# Patient Record
Sex: Male | Born: 2000 | Race: White | Hispanic: No | Marital: Single | Smoking: Never smoker
Health system: Southern US, Community
[De-identification: ages and names within clinical notes are randomized; demographics above are authoritative.]

## PROBLEM LIST (undated history)

## (undated) DIAGNOSIS — Q6650 Congenital pes planus, unspecified foot: Secondary | ICD-10-CM

## (undated) DIAGNOSIS — Z87442 Personal history of urinary calculi: Secondary | ICD-10-CM

## (undated) HISTORY — PX: INCISION AND DRAINAGE ABSCESS: SHX5864

---
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
## 2021-01-20 ENCOUNTER — Other Ambulatory Visit: Payer: Self-pay

## 2021-01-20 ENCOUNTER — Encounter (HOSPITAL_BASED_OUTPATIENT_CLINIC_OR_DEPARTMENT_OTHER): Payer: Self-pay | Admitting: Podiatry

## 2021-01-20 NOTE — Progress Notes (Signed)
Spoke w/ via phone for pre-op interview---pt Lab needs dos---- NONE              Lab results------NONE COVID test -----patient states asymptomatic no test needed Arrive at -------0730 NPO after MN NO Solid Food.  Clear liquids from MN until---0630 Med rec completed Medications to take morning of surgery -----NONE Diabetic medication -----NONE Patient instructed no nail polish to be worn day of surgery Patient instructed to bring photo id and insurance card day of surgery Patient aware to have Driver (ride ) / Buyer, retail (GRANDFATHER) caregiver    for 24 hours after surgery  Patient Special Instructions ----- NONE Pre-Op special Istructions -----HAVE NOT RECEIVED H&P FROM PCP VIA DR. PRICES OFFICE Patient verbalized understanding of instructions that were given at this phone interview. Patient denies shortness of breath, chest pain, fever, cough at this phone interview.

## 2021-01-26 NOTE — Progress Notes (Signed)
Received pt's pcp H&P via fax from Dr Samuella Cota office for surgery on 01-27-2021, placed w/ chart

## 2021-01-27 ENCOUNTER — Encounter (HOSPITAL_BASED_OUTPATIENT_CLINIC_OR_DEPARTMENT_OTHER): Payer: Self-pay | Admitting: Podiatry

## 2021-01-27 ENCOUNTER — Encounter: Payer: Self-pay | Admitting: Podiatry

## 2021-01-27 ENCOUNTER — Ambulatory Visit (HOSPITAL_BASED_OUTPATIENT_CLINIC_OR_DEPARTMENT_OTHER)
Admission: RE | Admit: 2021-01-27 | Discharge: 2021-01-27 | Disposition: A | Discharge status: Home / Self Care | Payer: Medicaid Other | Attending: Podiatry | Admitting: Podiatry

## 2021-01-27 ENCOUNTER — Ambulatory Visit (HOSPITAL_BASED_OUTPATIENT_CLINIC_OR_DEPARTMENT_OTHER): Payer: Medicaid Other | Admitting: Anesthesiology

## 2021-01-27 ENCOUNTER — Encounter (HOSPITAL_BASED_OUTPATIENT_CLINIC_OR_DEPARTMENT_OTHER)
Admission: RE | Disposition: A | Discharge status: Home / Self Care | Payer: Self-pay | Source: Home / Self Care | Attending: Podiatry

## 2021-01-27 ENCOUNTER — Other Ambulatory Visit (HOSPITAL_COMMUNITY): Payer: Self-pay

## 2021-01-27 DIAGNOSIS — M2142 Flat foot [pes planus] (acquired), left foot: Secondary | ICD-10-CM | POA: Diagnosis not present

## 2021-01-27 DIAGNOSIS — M7752 Other enthesopathy of left foot: Secondary | ICD-10-CM

## 2021-01-27 DIAGNOSIS — Q666 Other congenital valgus deformities of feet: Secondary | ICD-10-CM | POA: Diagnosis not present

## 2021-01-27 DIAGNOSIS — Z4889 Encounter for other specified surgical aftercare: Secondary | ICD-10-CM

## 2021-01-27 DIAGNOSIS — Q6689 Other  specified congenital deformities of feet: Secondary | ICD-10-CM | POA: Diagnosis not present

## 2021-01-27 DIAGNOSIS — Q6652 Congenital pes planus, left foot: Secondary | ICD-10-CM | POA: Diagnosis not present

## 2021-01-27 HISTORY — PX: FLAT FOOT CORRECTION: SHX6619

## 2021-01-27 SURGERY — REPAIR, PES PLANUS
Anesthesia: General | Site: Foot | Laterality: Left

## 2021-01-27 MED ORDER — OXYCODONE HCL 5 MG PO TABS: 5.0000 mg | ORAL_TABLET | Freq: Once | ORAL | Status: DC | PRN

## 2021-01-27 MED ORDER — OXYCODONE-ACETAMINOPHEN 5-325 MG PO TABS
1.0000 | ORAL_TABLET | ORAL | 0 refills | Status: DC | PRN
  Filled 2021-01-27: qty 20, 4d supply, fill #0

## 2021-01-27 MED ORDER — ONDANSETRON HCL 4 MG/2ML IJ SOLN
INTRAMUSCULAR | Status: DC | PRN
  Administered 2021-01-27: 4 mg via INTRAVENOUS

## 2021-01-27 MED ORDER — DEXMEDETOMIDINE (PRECEDEX) IN NS 20 MCG/5ML (4 MCG/ML) IV SYRINGE
PREFILLED_SYRINGE | INTRAVENOUS | Status: DC | PRN
  Administered 2021-01-27 (×2): 8 ug via INTRAVENOUS
  Administered 2021-01-27: 4 ug via INTRAVENOUS

## 2021-01-27 MED ORDER — LIDOCAINE HCL (PF) 2 % IJ SOLN
INTRAMUSCULAR | Status: AC
  Filled 2021-01-27: qty 10

## 2021-01-27 MED ORDER — BUPIVACAINE LIPOSOME 1.3 % IJ SUSP
INTRAMUSCULAR | Status: DC | PRN
  Administered 2021-01-27: 10 mL via PERINEURAL

## 2021-01-27 MED ORDER — ONDANSETRON HCL 4 MG/2ML IJ SOLN
4.0000 mg | Freq: Once | INTRAMUSCULAR | Status: AC | PRN
  Administered 2021-01-27: 4 mg via INTRAVENOUS

## 2021-01-27 MED ORDER — BUPIVACAINE HCL 0.5 % IJ SOLN
INTRAMUSCULAR | Status: DC | PRN
  Administered 2021-01-27: 15 mL

## 2021-01-27 MED ORDER — ACETAMINOPHEN 325 MG PO TABS: 325.0000 mg | ORAL_TABLET | ORAL | Status: DC | PRN

## 2021-01-27 MED ORDER — FENTANYL CITRATE (PF) 100 MCG/2ML IJ SOLN
INTRAMUSCULAR | Status: AC
  Filled 2021-01-27: qty 2

## 2021-01-27 MED ORDER — ONDANSETRON HCL 4 MG/2ML IJ SOLN
INTRAMUSCULAR | Status: AC
  Filled 2021-01-27: qty 2

## 2021-01-27 MED ORDER — DEXAMETHASONE SODIUM PHOSPHATE 10 MG/ML IJ SOLN
INTRAMUSCULAR | Status: AC
  Filled 2021-01-27: qty 1

## 2021-01-27 MED ORDER — LIDOCAINE HCL (CARDIAC) PF 100 MG/5ML IV SOSY
PREFILLED_SYRINGE | INTRAVENOUS | Status: DC | PRN
  Administered 2021-01-27: 50 mg via INTRAVENOUS

## 2021-01-27 MED ORDER — FENTANYL CITRATE (PF) 100 MCG/2ML IJ SOLN
INTRAMUSCULAR | Status: DC | PRN
  Administered 2021-01-27: 25 ug via INTRAVENOUS
  Administered 2021-01-27: 12.5 ug via INTRAVENOUS
  Administered 2021-01-27: 25 ug via INTRAVENOUS
  Administered 2021-01-27: 12.5 ug via INTRAVENOUS
  Administered 2021-01-27: 25 ug via INTRAVENOUS

## 2021-01-27 MED ORDER — FENTANYL CITRATE (PF) 100 MCG/2ML IJ SOLN
100.0000 ug | Freq: Once | INTRAMUSCULAR | Status: AC
  Administered 2021-01-27: 100 ug via INTRAVENOUS

## 2021-01-27 MED ORDER — MIDAZOLAM HCL 2 MG/2ML IJ SOLN
2.0000 mg | Freq: Once | INTRAMUSCULAR | Status: AC
  Administered 2021-01-27: 2 mg via INTRAVENOUS

## 2021-01-27 MED ORDER — ACETAMINOPHEN 160 MG/5ML PO SOLN: 325.0000 mg | ORAL | Status: DC | PRN

## 2021-01-27 MED ORDER — PROPOFOL 10 MG/ML IV BOLUS
INTRAVENOUS | Status: AC
  Filled 2021-01-27: qty 40

## 2021-01-27 MED ORDER — FENTANYL CITRATE (PF) 100 MCG/2ML IJ SOLN: 25.0000 ug | INTRAMUSCULAR | Status: DC | PRN

## 2021-01-27 MED ORDER — SODIUM CHLORIDE 0.9 % IV SOLN
INTRAVENOUS | Status: AC
  Filled 2021-01-27: qty 2

## 2021-01-27 MED ORDER — MEPERIDINE HCL 25 MG/ML IJ SOLN: 6.2500 mg | INTRAMUSCULAR | Status: DC | PRN

## 2021-01-27 MED ORDER — OXYCODONE HCL 5 MG/5ML PO SOLN: 5.0000 mg | Freq: Once | ORAL | Status: DC | PRN

## 2021-01-27 MED ORDER — CEPHALEXIN 500 MG PO CAPS
500.0000 mg | ORAL_CAPSULE | Freq: Two times a day (BID) | ORAL | 0 refills | Status: DC
  Filled 2021-01-27: qty 14, 7d supply, fill #0

## 2021-01-27 MED ORDER — DEXAMETHASONE SODIUM PHOSPHATE 4 MG/ML IJ SOLN
INTRAMUSCULAR | Status: DC | PRN
  Administered 2021-01-27: 8 mg via INTRAVENOUS

## 2021-01-27 MED ORDER — GLYCOPYRROLATE PF 0.2 MG/ML IJ SOSY
PREFILLED_SYRINGE | INTRAMUSCULAR | Status: AC
  Filled 2021-01-27: qty 2

## 2021-01-27 MED ORDER — PROPOFOL 10 MG/ML IV BOLUS
INTRAVENOUS | Status: DC | PRN
  Administered 2021-01-27: 50 mg via INTRAVENOUS
  Administered 2021-01-27: 20 mg via INTRAVENOUS
  Administered 2021-01-27: 40 mg via INTRAVENOUS
  Administered 2021-01-27: 200 mg via INTRAVENOUS

## 2021-01-27 MED ORDER — GLYCOPYRROLATE 0.2 MG/ML IJ SOLN
INTRAMUSCULAR | Status: DC | PRN
  Administered 2021-01-27: .1 mg via INTRAVENOUS

## 2021-01-27 MED ORDER — SODIUM CHLORIDE 0.9 % IV SOLN
2.0000 g | INTRAVENOUS | Status: AC
  Administered 2021-01-27: 2 g via INTRAVENOUS

## 2021-01-27 MED ORDER — LACTATED RINGERS IV SOLN: INTRAVENOUS | Status: DC

## 2021-01-27 MED ORDER — MIDAZOLAM HCL 2 MG/2ML IJ SOLN
INTRAMUSCULAR | Status: AC
  Filled 2021-01-27: qty 2

## 2021-01-27 SURGICAL SUPPLY — 94 items
1.2 K-Wire ×1 IMPLANT
2.0 Drill Bit ×1 IMPLANT
3.2 Drill bit ×1 IMPLANT
ADH SKN CLS APL DERMABOND .7 (GAUZE/BANDAGES/DRESSINGS)
APL PRP STRL LF DISP 70% ISPRP (MISCELLANEOUS) ×2
AUGMENT INJECTABLE KIT 3CC (Tissue) ×1 IMPLANT
BANDAGE ESMARK 6X9 LF (GAUZE/BANDAGES/DRESSINGS) ×1 IMPLANT
BIT DRILL 2.0 702453 (BIT) ×1 IMPLANT
BIT DRILL 3.2 705232 (BIT) ×1 IMPLANT
BLADE HEX COATED 2.75 (ELECTRODE) ×2 IMPLANT
BLADE OSCILLATING/SAGITTAL (BLADE) ×2
BLADE SURG 15 STRL LF DISP TIS (BLADE) ×2 IMPLANT
BLADE SURG 15 STRL SS (BLADE) ×14
BLADE SW THK.38XMED LNG THN (BLADE) IMPLANT
BNDG CMPR 9X6 STRL LF SNTH (GAUZE/BANDAGES/DRESSINGS) ×1
BNDG ELASTIC 3X5.8 VLCR STR LF (GAUZE/BANDAGES/DRESSINGS) ×1 IMPLANT
BNDG ELASTIC 4X5.8 VLCR STR LF (GAUZE/BANDAGES/DRESSINGS) ×1 IMPLANT
BNDG ELASTIC 6X5.8 VLCR STR LF (GAUZE/BANDAGES/DRESSINGS) ×1 IMPLANT
BNDG ESMARK 6X9 LF (GAUZE/BANDAGES/DRESSINGS) ×2
BNDG GAUZE ELAST 4 BULKY (GAUZE/BANDAGES/DRESSINGS) ×2 IMPLANT
BUR OVAL CARBIDE 4.0 (BURR) ×1 IMPLANT
CHLORAPREP W/TINT 26 (MISCELLANEOUS) ×3 IMPLANT
COVER BACK TABLE 60X90IN (DRAPES) ×2 IMPLANT
COVER LIGHT HANDLE UNIVERSAL (MISCELLANEOUS) ×2 IMPLANT
COVER MAYO STAND STRL (DRAPES) ×1 IMPLANT
CUFF TOURN SGL QUICK 18X4 (TOURNIQUET CUFF) IMPLANT
CUFF TOURN SGL QUICK 24 (TOURNIQUET CUFF) ×2
CUFF TRNQT CYL 24X4X16.5-23 (TOURNIQUET CUFF) IMPLANT
DERMABOND ADVANCED (GAUZE/BANDAGES/DRESSINGS)
DERMABOND ADVANCED .7 DNX12 (GAUZE/BANDAGES/DRESSINGS) ×1 IMPLANT
DRAPE 3/4 80X56 (DRAPES) ×2 IMPLANT
DRAPE EXTREMITY T 121X128X90 (DISPOSABLE) ×2 IMPLANT
DRAPE INCISE 23X17 IOBAN STRL (DRAPES)
DRAPE INCISE 23X17 STRL (DRAPES) ×1 IMPLANT
DRAPE INCISE IOBAN 23X17 STRL (DRAPES) IMPLANT
DRAPE OEC MINIVIEW 54X84 (DRAPES) ×2 IMPLANT
DRAPE SHEET LG 3/4 BI-LAMINATE (DRAPES) IMPLANT
DRAPE U-SHAPE 47X51 STRL (DRAPES) ×1 IMPLANT
DRSG EMULSION OIL 3X3 NADH (GAUZE/BANDAGES/DRESSINGS) ×1 IMPLANT
DRSG PAD ABDOMINAL 8X10 ST (GAUZE/BANDAGES/DRESSINGS) IMPLANT
ELECT REM PT RETURN 9FT ADLT (ELECTROSURGICAL) ×2
ELECTRODE REM PT RTRN 9FT ADLT (ELECTROSURGICAL) ×1 IMPLANT
GAUZE 4X4 16PLY ~~LOC~~+RFID DBL (SPONGE) ×3 IMPLANT
GAUZE SPONGE 4X4 12PLY STRL (GAUZE/BANDAGES/DRESSINGS) ×3 IMPLANT
GAUZE XEROFORM 1X8 LF (GAUZE/BANDAGES/DRESSINGS) ×1 IMPLANT
GLOVE SRG 8 PF TXTR STRL LF DI (GLOVE) ×1 IMPLANT
GLOVE SURG ENC MOIS LTX SZ7.5 (GLOVE) ×2 IMPLANT
GLOVE SURG UNDER POLY LF SZ8 (GLOVE) ×2
GOWN STRL REUS W/TWL XL LVL3 (GOWN DISPOSABLE) ×3 IMPLANT
GUIDEWIRE UNTHREAD 3.2X150 (WIRE) ×1 IMPLANT
GUIDEWIRE UNTHREADED 2.0X150 (WIRE) ×4 IMPLANT
K-WIRE NONTHRD 1.2 (Wire) ×2 IMPLANT
KIT TURNOVER CYSTO (KITS) ×2 IMPLANT
KWIRE NONTHRD 1.2 (Wire) IMPLANT
NDL HYPO 25X1 1.5 SAFETY (NEEDLE) IMPLANT
NEEDLE HYPO 25X1 1.5 SAFETY (NEEDLE) IMPLANT
NS IRRIG 1000ML POUR BTL (IV SOLUTION) ×1 IMPLANT
PACK BASIN DAY SURGERY FS (CUSTOM PROCEDURE TRAY) ×2 IMPLANT
PACK INSTRUMENT ORTHOLOC JOINT (ORTHOPEDIC DISPOSABLE SUPPLIES) ×1 IMPLANT
PAD CAST 4YDX4 CTTN HI CHSV (CAST SUPPLIES) IMPLANT
PADDING CAST ABS 4INX4YD NS (CAST SUPPLIES)
PADDING CAST ABS COTTON 4X4 ST (CAST SUPPLIES) ×1 IMPLANT
PADDING CAST COTTON 4X4 STRL (CAST SUPPLIES) ×2
PENCIL SMOKE EVACUATOR (MISCELLANEOUS) ×1 IMPLANT
PUTTY DBM 1CC 3102-1001 (Putty) ×1 IMPLANT
PUTTY DBM 2.5CC 3102-1002 (Putty) ×1 IMPLANT
SCREW HEADLESS 4.0X46 (Screw) ×1 IMPLANT
SCREW HEADLESS COMP 5.0X60 (Screw) ×1 IMPLANT
SCREW SHORT THREAD 7.0X85 (Screw) ×1 IMPLANT
SLEEVE SCD COMPRESS KNEE MED (STOCKING) ×1 IMPLANT
SPLINT FIBERGLASS 5X30 (CAST SUPPLIES) ×1 IMPLANT
STAPLER VISISTAT 35W (STAPLE) ×1 IMPLANT
STOCKINETTE 6  STRL (DRAPES) ×2
STOCKINETTE 6 STRL (DRAPES) ×1 IMPLANT
SUCTION FRAZIER HANDLE 10FR (MISCELLANEOUS)
SUCTION TUBE FRAZIER 10FR DISP (MISCELLANEOUS) IMPLANT
SUT ETHIBOND 2-0 V-5 NDL (SUTURE) ×1 IMPLANT
SUT ETHIBOND 2-0 V-5 NEEDLE (SUTURE) IMPLANT
SUT ETHILON 3 0 PS 1 (SUTURE) ×3 IMPLANT
SUT MERSILENE 4 0 P 3 (SUTURE) IMPLANT
SUT MNCRL AB 3-0 PS2 27 (SUTURE) ×2 IMPLANT
SUT MON AB 5-0 PS2 18 (SUTURE) ×1 IMPLANT
SUT SILK 3 0 SH 30 (SUTURE) ×1 IMPLANT
SUT VIC AB 0 CT1 36 (SUTURE) IMPLANT
SUT VIC AB 2-0 PS2 27 (SUTURE) ×2 IMPLANT
SUT VIC AB 2-0 SH 27 (SUTURE) ×2
SUT VIC AB 2-0 SH 27XBRD (SUTURE) IMPLANT
SUT VIC AB 3-0 FS2 27 (SUTURE) ×1 IMPLANT
SUT VIC AB 4-0 PS2 18 (SUTURE) IMPLANT
SYR BULB EAR ULCER 3OZ GRN STR (SYRINGE) ×2 IMPLANT
SYR CONTROL 10ML LL (SYRINGE) IMPLANT
TOWEL OR 17X26 10 PK STRL BLUE (TOWEL DISPOSABLE) ×2 IMPLANT
TUBE CONNECTING 12X1/4 (SUCTIONS) ×2 IMPLANT
UNDERPAD 30X36 HEAVY ABSORB (UNDERPADS AND DIAPERS) ×2 IMPLANT

## 2021-01-27 NOTE — Anesthesia Procedure Notes (Signed)
Anesthesia Regional Block: Popliteal block   Pre-Anesthetic Checklist: , timeout performed,  Correct Patient, Correct Site, Correct Laterality,  Correct Procedure, Correct Position, site marked,  Risks and benefits discussed,  Surgical consent,  Pre-op evaluation,  At surgeon's request and post-op pain management  Laterality: Left  Prep: chloraprep       Needles:  Injection technique: Single-shot  Needle Type: Echogenic Stimulator Needle     Needle Length: 5cm  Needle Gauge: 22     Additional Needles:   Procedures:, nerve stimulator,,, ultrasound used (permanent image in chart),,     Nerve Stimulator or Paresthesia:  Response: foot, 0.45 mA  Additional Responses:   Narrative:  Start time: 01/27/2021 7:50 AM End time: 01/27/2021 7:58 AM Injection made incrementally with aspirations every 5 mL.  Performed by: Personally  Anesthesiologist: Bethena Midget, MD  Additional Notes: Functioning IV was confirmed and monitors were applied.  A 73mm 22ga Arrow echogenic stimulator needle was used. Sterile prep and drape,hand hygiene and sterile gloves were used. Ultrasound guidance: relevant anatomy identified, needle position confirmed, local anesthetic spread visualized around nerve(s)., vascular puncture avoided.  Image printed for medical record. Negative aspiration and negative test dose prior to incremental administration of local anesthetic. The patient tolerated the procedure well.

## 2021-01-27 NOTE — Brief Op Note (Signed)
01/27/2021  1:31 PM  PATIENT:  Newt Lukes  20 y.o. male  PRE-OPERATIVE DIAGNOSIS:  FLAT FOOT LEFT FOOT  POST-OPERATIVE DIAGNOSIS:  FLAT FOOT LEFT FOOT  PROCEDURE:  Procedure(s): FLAT FOOT CORRECTION (Left)  SURGEON:  Surgeon(s) and Role:    * Park Liter, DPM - Primary    * Vivi Barrack, DPM - Assisting    ANESTHESIA:   regional and general  EBL:  50 mL   BLOOD ADMINISTERED:none  DRAINS: none   LOCAL MEDICATIONS USED:  NONE  SPECIMEN:  No Specimen  DISPOSITION OF SPECIMEN:  N/A  COUNTS:  YES  TOURNIQUET: See OR Record  DICTATION: .Office manager  PLAN OF CARE: Discharge to home after PACU  PATIENT DISPOSITION:  PACU - hemodynamically stable.   Delay start of Pharmacological VTE agent (>24hrs) due to surgical blood loss or risk of bleeding: not applicable

## 2021-01-27 NOTE — Progress Notes (Signed)
Assisted Dr. Oddono with left, ultrasound guided, popliteal block. Side rails up, monitors on throughout procedure. See vital signs in flow sheet. Tolerated Procedure well. 

## 2021-01-27 NOTE — Transfer of Care (Signed)
Immediate Anesthesia Transfer of Care Note  Patient: Kevin Leonard  Procedure(s) Performed: Procedure(s) (LRB): FLAT FOOT CORRECTION (Left)  Patient Location: PACU  Anesthesia Type: General  Level of Consciousness: awake, alert  and oriented  Airway & Oxygen Therapy: Patient Spontanous Breathing and Patient connected to nasal cannula oxygen  Post-op Assessment: Report given to PACU RN and Post -op Vital signs reviewed and stable  Post vital signs: Reviewed and stable  Complications: No apparent anesthesia complications Last Vitals:  Vitals Value Taken Time  BP 110/53 01/27/21 1333  Temp 36.8 C 01/27/21 1333  Pulse 99 01/27/21 1341  Resp 16 01/27/21 1341  SpO2 98 % 01/27/21 1341  Vitals shown include unvalidated device data.  Last Pain:  Vitals:   01/27/21 1333  TempSrc:   PainSc: Asleep      Patients Stated Pain Goal: 5 (01/27/21 5053)  Complications: No notable events documented.

## 2021-01-27 NOTE — Discharge Instructions (Addendum)
After Surgery Instructions   1) If you are recuperating from surgery anywhere other than home, please be sure to leave [place] the number where you can be reached.  2) Go directly home and rest.  3) Keep the operated foot(feet) elevated six inches above the hip when sitting or lying down. This will help control swelling and pain.  4) Support the elevated foot and leg with pillows. DO NOT PLACE PILLOWS UNDER THE KNEE.  5) DO NOT REMOVE or get your bandages WET, unless you were given different instructions by your doctor to do so. This increases the risk of infection.  6) Wear your surgical shoe or surgical boot at all times when you are up on your feet.  7) A limited amount of pain and swelling may occur. The skin may take on a bruised appearance. DO NOT BE ALARMED, THIS IS NORMAL.  8) For slight pain and swelling, apply an ice pack directly over the bandages for 15 minutes only out of each hour of the day. Continue until seen in the office for your first post op visit. DO NOT APPLY ANY FORM OF HEAT TO THE AREA.  9) Have prescriptions filled immediately and take as directed.  10) Drink lots of liquids, water and juice to stay hydrated.  11) CALL IMMEDIATELY IF:  *Bleeding continues until the following day of surgery  *Pain increases and/or does not respond to medication  *Bandages or cast appears to tight  *If your bandage gets wet  *Trip, fall or stump your surgical foot  *If your temperature goes above 101  *If you have ANY questions at all  12) You are expected to be non-weightbearing after your surgery.   If you need to reach the nurse for any reason, please call: [place]/: [phone] [place]: [phone] [place]: [phone]   Post Anesthesia Home Care Instructions  Activity: Get plenty of rest for the remainder of the day. A responsible individual must stay with you for 24 hours following the procedure.  For the next 24 hours, DO NOT: -Drive  a car -Advertising copywriter -Drink alcoholic beverages -Take any medication unless instructed by your physician -Make any legal decisions or sign important papers.  Meals: Start with liquid foods such as gelatin or soup. Progress to regular foods as tolerated. Avoid greasy, spicy, heavy foods. If nausea and/or vomiting occur, drink only clear liquids until the nausea and/or vomiting subsides. Call your physician if vomiting continues.  Special Instructions/Symptoms: Your throat may feel dry or sore from the anesthesia or the breathing tube placed in your throat during surgery. If this causes discomfort, gargle with warm salt water. The discomfort should disappear within 24 hours.     Regional Anesthesia Blocks  1. Numbness or the inability to move the "blocked" extremity may last from 3-48 hours after placement. The length of time depends on the medication injected and your individual response to the medication. If the numbness is not going away after 48 hours, call your surgeon.  2. The extremity that is blocked will need to be protected until the numbness is gone and the  Strength has returned. Because you cannot feel it, you will need to take extra care to avoid injury. Because it may be weak, you may have difficulty moving it or using it. You may not know what position it is in without looking at it while the block is in effect.  3. For blocks in the legs and feet, returning to weight bearing and walking  needs to be done carefully. You will need to wait until the numbness is entirely gone and the strength has returned. You should be able to move your leg and foot normally before you try and bear weight or walk. You will need someone to be with you when you first try to ensure you do not fall and possibly risk injury.  4. Bruising and tenderness at the needle site are common side effects and will resolve in a few days.  5. Persistent numbness or new problems with movement should be  communicated to the surgeon.   Information for Discharge Teaching: EXPAREL (bupivacaine liposome injectable suspension)   Your surgeon or anesthesiologist gave you EXPAREL(bupivacaine) to help control your pain after surgery.  EXPAREL is a local anesthetic that provides pain relief by numbing the tissue around the surgical site. EXPAREL is designed to release pain medication over time and can control pain for up to 72 hours. Depending on how you respond to EXPAREL, you may require less pain medication during your recovery.  Possible side effects: Temporary loss of sensation or ability to move in the area where bupivacaine was injected. Nausea, vomiting, constipation Rarely, numbness and tingling in your mouth or lips, lightheadedness, or anxiety may occur. Call your doctor right away if you think you may be experiencing any of these sensations, or if you have other questions regarding possible side effects.  Follow all other discharge instructions given to you by your surgeon or nurse. Eat a healthy diet and drink plenty of water or other fluids.  If you return to the hospital for any reason within 96 hours following the administration of EXPAREL, it is important for health care providers to know that you have received this anesthetic. A teal colored band has been placed on your arm with the date, time and amount of EXPAREL you have received in order to alert and inform your health care providers. Please leave this armband in place for the full 96 hours following administration, and then you may remove the band.  (May remove band Sunday, July 31st)

## 2021-01-27 NOTE — Anesthesia Postprocedure Evaluation (Signed)
Anesthesia Post Note  Patient: Kevin Leonard  Procedure(s) Performed: FLAT FOOT CORRECTION (Left: Foot)     Patient location during evaluation: PACU Anesthesia Type: General Level of consciousness: awake and alert Pain management: pain level controlled Vital Signs Assessment: post-procedure vital signs reviewed and stable Respiratory status: spontaneous breathing, nonlabored ventilation, respiratory function stable and patient connected to nasal cannula oxygen Cardiovascular status: blood pressure returned to baseline and stable Postop Assessment: no apparent nausea or vomiting Anesthetic complications: no   No notable events documented.  Last Vitals:  Vitals:   01/27/21 1400 01/27/21 1415  BP: 115/61 118/72  Pulse: (!) 108 (!) 111  Resp: 16 16  Temp:  36.6 C  SpO2: 96% 96%    Last Pain:  Vitals:   01/27/21 1415  TempSrc:   PainSc: 0-No pain                 Nayab Aten

## 2021-01-27 NOTE — Anesthesia Procedure Notes (Signed)
Procedure Name: LMA Insertion Date/Time: 01/27/2021 9:00 AM Performed by: Earmon Phoenix, CRNA Pre-anesthesia Checklist: Patient identified, Emergency Drugs available, Suction available, Patient being monitored and Timeout performed Patient Re-evaluated:Patient Re-evaluated prior to induction Oxygen Delivery Method: Circle system utilized Preoxygenation: Pre-oxygenation with 100% oxygen Induction Type: IV induction Ventilation: Mask ventilation without difficulty LMA: LMA inserted LMA Size: 5.0 Number of attempts: 1 Placement Confirmation: positive ETCO2, CO2 detector and breath sounds checked- equal and bilateral Tube secured with: Tape Dental Injury: Teeth and Oropharynx as per pre-operative assessment

## 2021-01-27 NOTE — Anesthesia Preprocedure Evaluation (Signed)
Anesthesia Evaluation  Patient identified by MRN, date of birth, ID band Patient awake    Reviewed: Allergy & Precautions, H&P , NPO status , Patient's Chart, lab work & pertinent test results, reviewed documented beta blocker date and time   Airway Mallampati: II  TM Distance: >3 FB Neck ROM: full   Comment: Prognathia  Dental no notable dental hx. (+) Teeth Intact, Dental Advisory Given   Pulmonary neg pulmonary ROS,    Pulmonary exam normal breath sounds clear to auscultation       Cardiovascular Exercise Tolerance: Good negative cardio ROS   Rhythm:regular Rate:Normal     Neuro/Psych negative neurological ROS  negative psych ROS   GI/Hepatic negative GI ROS, Neg liver ROS,   Endo/Other  negative endocrine ROS  Renal/GU negative Renal ROS  negative genitourinary   Musculoskeletal   Abdominal   Peds  Hematology negative hematology ROS (+)   Anesthesia Other Findings   Reproductive/Obstetrics negative OB ROS                             Anesthesia Physical Anesthesia Plan  ASA: 2  Anesthesia Plan: General   Post-op Pain Management: GA combined w/ Regional for post-op pain   Induction:   PONV Risk Score and Plan: 2 and Ondansetron and Dexamethasone  Airway Management Planned: Oral ETT and LMA  Additional Equipment: None  Intra-op Plan:   Post-operative Plan: Extubation in OR  Informed Consent: I have reviewed the patients History and Physical, chart, labs and discussed the procedure including the risks, benefits and alternatives for the proposed anesthesia with the patient or authorized representative who has indicated his/her understanding and acceptance.     Dental Advisory Given  Plan Discussed with: CRNA and Anesthesiologist  Anesthesia Plan Comments: (Discussed both nerve block for pain relief post-op and GA; including NV, sore throat, dental injury, and pulmonary  complications)        Anesthesia Quick Evaluation

## 2021-01-27 NOTE — H&P (Signed)
  Subjective:  Patient ID: Kevin Leonard, male    DOB: [birth year],  MRN: [id number]  No chief complaint on file.  20 y.o. male presents today for elective correction of his flatfoot deformity. All questions answered and addressed.  Objective:  Physical Exam: Physical Exam: warm, good capillary refill, no trophic changes or ulcerative lesions, normal DP and PT pulses and normal sensory exam. Left Foot: hindfoot rectus, midfoot abductus present. No pain to palpation  Right Foot: hindfoot rectus, no pain to palpation   Radiographs: X-ray of the left foot: sever pes planus with forefoot abduction and talar declination.   Assessment/Plan:  Patient was evaluated and treated and all questions answered.  Pes Planus -Consent form reviewed and signed by patient -LLE marked -Ok to proceed to OR -Planned procedures: correction of flatfoot left foot with joint fusions as indicated - STJ, TNJ, CCJ, 1st met cuneiform and naviculocuneiform. Includes removal of hardware as needed.

## 2021-01-27 NOTE — Progress Notes (Signed)
Orthopedic Tech Progress Note Patient Details:  Kevin Leonard [birth year] [id number]  Ortho Devices Type of Ortho Device: CAM walker   Post Interventions Instructions Provided: Care of device  Kevin Leonard 01/27/2021, 2:09 PM

## 2021-01-29 ENCOUNTER — Telehealth: Payer: Self-pay | Admitting: *Deleted

## 2021-01-29 NOTE — Telephone Encounter (Signed)
Called and spoke with the grand father and stated that I was calling to see how the patient was doing after having surgery with Dr Ardelle Anton and patient is doing pretty good and there is not any fever and not any chills or nausea and did hurt some yesterday and I stated to ice and elevate and stay off of it as much as possible and to call the office if any concerns or questions. Misty Stanley

## 2021-02-01 ENCOUNTER — Encounter (HOSPITAL_BASED_OUTPATIENT_CLINIC_OR_DEPARTMENT_OTHER): Payer: Self-pay | Admitting: Podiatry

## 2021-02-02 ENCOUNTER — Ambulatory Visit (INDEPENDENT_AMBULATORY_CARE_PROVIDER_SITE_OTHER): Payer: Medicaid Other | Admitting: Podiatry

## 2021-02-02 ENCOUNTER — Encounter: Payer: Medicaid Other | Admitting: Podiatry

## 2021-02-02 ENCOUNTER — Other Ambulatory Visit: Payer: Self-pay

## 2021-02-02 ENCOUNTER — Ambulatory Visit (INDEPENDENT_AMBULATORY_CARE_PROVIDER_SITE_OTHER): Payer: Medicaid Other

## 2021-02-02 DIAGNOSIS — Q6652 Congenital pes planus, left foot: Secondary | ICD-10-CM

## 2021-02-02 DIAGNOSIS — Z9889 Other specified postprocedural states: Secondary | ICD-10-CM

## 2021-02-02 NOTE — Op Note (Signed)
Patient Name: Kevin Leonard DOB: Mar 27, 2001  MRN: [id number]   Date of Surgery: 01/27/2021  Surgeon: Dr. Hardie Pulley, DPM Assistants: Dr. Celesta Gentile, DPM  Pre-operative Diagnosis:  Flatfoot, hindfoot valgus, forefoot abductus Post-operative Diagnosis:  Same Procedures:  1) Subtalar arthrodesis  2) Talonavicular arthrodesis  3) Removal of hardware Pathology/Specimens: * No specimens in log * Anesthesia: General Hemostasis:  Total Tourniquet Time Documented: Thigh (Left) - 108 minutes Thigh (Left) - 104 minutes Total: Thigh (Left) - 212 minutes  Estimated Blood Loss: 50 mL Materials:  Implant Name Type Inv. Item Serial No. Manufacturer Lot No. LRB No. Used Action  SCREW HEADLESS COMP 5.0X50 - [id number] Screw SCREW HEADLESS COMP 5.0X50  [employer] ON SET Left 1 Explanted  SCREW HEADLESS COMP 5.0X60 - [id number] Screw SCREW HEADLESS COMP 5.0X60  [employer] IN TRAY Left 1 Explanted  PUTTY DBM 1CC 3102-1001 - [id number] Putty PUTTY DBM 1CC 3102-1001  [employer] [id number] Left 1 Implanted  PUTTY DBM 2.5CC 3102-1002 - [id number] Putty PUTTY DBM 2.5CC 3102-1002  [employer] [id number] Left 1 Implanted  AUGMENT INJECTABLE KIT 3CC - [id number] Tissue AUGMENT INJECTABLE KIT 3CC  [employer] [id number] Left 1 Implanted  SCREW SHORT THREAD 7.0X85 - [id number] Screw SCREW SHORT THREAD 7.0X85  [employer] IN TRAY Left 1 Implanted  SCREW HEADLESS COMP 5.0X60 - [id number] Screw SCREW HEADLESS COMP 5.0X60  [employer] IN TRAY Left 1 Implanted  SCREW HEADLESS 4.0X46 - [id number] Screw SCREW HEADLESS 4.0X46  [employer]  Left 1 Implanted  K-WIRE NONTHRD 1.2 - [id number] Wire K-WIRE NONTHRD 1.2  [employer]  Left 1 Implanted and Explanted   Medications: none Complications: none  Indications for Procedure:  This is a 20 y.o. male with a severe flatfoot deformity previously failed surgical intervention.  Discussed would  benefit from revision.  All risk benefits and alternatives of surgery discussed with the patient no guarantees given.   Procedure in Detail: Patient was identified in pre-operative holding area. Formal consent was signed and the left lower extremity was marked. Patient was brought back to the operating room. Anesthesia was induced. The extremity was prepped and draped in the usual sterile fashion. Timeout was taken to confirm patient name, laterality, and procedure prior to incision.   Attention was then directed to the medial aspect of the hindfoot over the talar neck.  Fluoroscopy was used to confirm the landmarks for the talonavicular joint.  An incision was made overlying this area and dissection was carried down through skin and subcu tissue with care to avoid all vital neurovascular structures all bleeders were cauterized with cautery.  Dissection was continued down to level of the talonavicular joint a linear incision was made in the joint capsule.  The capsule was then gently freed from the bone and the talonavicular joint was exposed.  K wires and abdomen retractors were then placed into the talus and navicular bone and the joint was opened.  The joint was then prepped for fusion with a curette and osteotomes.  The subchondral plate was penetrated with a K wire.  Attention directed to the lateral aspect of foot.  Medihoney was then made overlying the sinus tarsi.  Dissection continued in similar manner to the subtalar joint capsule.  Capsule was incised and gently freed from the bone.  A lamina spreader was placed in the sinus tarsi to expose the posterior facet.  Cartilaginous surface of the posterior aspect of the subtalar joint were removed with osteotome and curette.  Subchondral  plate was punctured with a K wire in preparation for fusion.  Attention was directed to the the posterior aspect of heel.  A linear incision was made in the posterior heel at the area of the previous insertion of  hardware.  K wires were placed into the previous tracks of the screws under fluoroscopic guidance. The screws were then sequentially removed.   At this point the tourniquet was deflated for tourniquet break and 20 minutes allowed to pass prior to reinflation.  Both joints were examined to ensure that all cartilage have been removed in preparation for fusion.  A mixture of DBX putty and [facility] augment graft was placed into each arthrodesis site.  The talonavicular joint was then reduced to maximally adduct the foot and reduce the flatfoot deformity.  This was then held in position with K wires.  Once positioning was confirmed this was then fixated with two 3.0 crossing screws. The subtalar joint was then similarly reduced and held in position with a K-wire. Fluoroscopy was used to confirm adequate positioning. The arthrodesis site was then fixated with a size 7 screw with good compression across the subtalar joint noted.  Final fluoroscopic images showed good reduction of hindfoot deformity with mild midfoot abductus, as well as compression across all arthrodesis sites.  At this point all incisions were then copiously irrigated.  They were closed with 3-0 Vicryl 4-0 Monocryl and 4-nylon and skin staples.   The foot was then dressed with Xeroform 4 x 4's Kerlix, cast padding, posterior splint. Patient tolerated the procedure well.   Dr. Celesta Gentile was scrubbed and present for the majority of procedure including all integral parts, and assisted in removal of hardware, retraction, placement of hardware.  Disposition: Following a period of post-operative monitoring, patient will be transferred home.

## 2021-02-02 NOTE — Progress Notes (Signed)
  Subjective:  Patient ID: Kevin Leonard, male    DOB: 2000/12/30,  MRN: [id number]  Chief Complaint  Patient presents with   Routine Post Op    POV #1 DOS 01/27/2021 CORRECTION OF FLAT FOOT DEFORMITY. Pt states he is doing well no NV, fever or chills.    DOS: 01/27/21 Procedure: Flatfoot correction left foot with subtalar/talonavicular arthrodesis, removal of hardware   20 y.o. male presents with the above complaint. History confirmed with patient.   Objective:  Physical Exam: tenderness at the surgical site, local edema noted, and calf supple, nontender. Incision: healing well, no significant drainage, no dehiscence, no significant erythema  No images are attached to the encounter.  Radiographs: X-ray of the left foot: consistent with post-op state, with good alignment and no evidence of hardware complication  Assessment:   1. Post-operative state   2. Congenital pes planus of left foot    Plan:  Patient was evaluated and treated and all questions answered.  Post-operative State -XR reviewed with patient -Dressing applied consisting of sterile gauze, kerlix, and ACE bandage, placed in Posterior splint -NWB with crutches -No Pain medication refill needed today -Plan for staple removal next week. -XRs needed at follow-up: none   No follow-ups on file.

## 2021-02-16 ENCOUNTER — Other Ambulatory Visit: Payer: Self-pay

## 2021-02-16 ENCOUNTER — Encounter: Payer: Medicaid Other | Admitting: Podiatry

## 2021-02-16 ENCOUNTER — Ambulatory Visit (INDEPENDENT_AMBULATORY_CARE_PROVIDER_SITE_OTHER): Payer: Medicaid Other | Admitting: Podiatry

## 2021-02-16 DIAGNOSIS — Q6652 Congenital pes planus, left foot: Secondary | ICD-10-CM

## 2021-02-16 DIAGNOSIS — Z9889 Other specified postprocedural states: Secondary | ICD-10-CM

## 2021-02-16 NOTE — Progress Notes (Signed)
  Subjective:  Patient ID: Kevin Leonard, male    DOB: 2001/06/16,  MRN: [id number]  Chief Complaint  Patient presents with   Routine Post Op    POV #2 DOS 01/27/2021    DOS: 01/27/21 Procedure: Flatfoot correction left foot with subtalar/talonavicular arthrodesis, removal of hardware   20 y.o. male presents with the above complaint. History confirmed with patient. Doing well, taking OTC pain medication not having significant pain.  Objective:  Physical Exam: tenderness at the surgical site, local edema noted, and calf supple, nontender. Incision: healing well, no significant drainage, no dehiscence, no significant erythema   Assessment:   1. Congenital pes planus of left foot   2. Post-operative state    Plan:  Patient was evaluated and treated and all questions answered.  Post-operative State -XR reviewed with patient -Dressing applied consisting of sterile gauze, kerlix, and ACE bandage, placed in CAM boot -NWB with crutches -Every other staple removed today. Plan for full staple removal next week. -XRs needed at follow-up: none  No follow-ups on file.

## 2021-03-02 ENCOUNTER — Ambulatory Visit (INDEPENDENT_AMBULATORY_CARE_PROVIDER_SITE_OTHER): Payer: Medicaid Other | Admitting: Podiatry

## 2021-03-02 ENCOUNTER — Ambulatory Visit (INDEPENDENT_AMBULATORY_CARE_PROVIDER_SITE_OTHER): Payer: Medicaid Other

## 2021-03-02 ENCOUNTER — Other Ambulatory Visit: Payer: Self-pay

## 2021-03-02 DIAGNOSIS — Z9889 Other specified postprocedural states: Secondary | ICD-10-CM

## 2021-03-02 NOTE — Progress Notes (Signed)
  Subjective:  Patient ID: Kevin Leonard, male    DOB: 02-Mar-2001,  MRN: [id number]  Chief Complaint  Patient presents with   Routine Post Op    POV #3DOS 01/27/2021 CORRECTION OF FLAT FOOT DEFORMITY. Pt states he is doing well. Edema has decreased a lot per pt. No questions or concerns at the time.    DOS: 01/27/21 Procedure: Flatfoot correction left foot with subtalar/talonavicular arthrodesis, removal of hardware   20 y.o. male presents with the above complaint. History confirmed with patient. States the edema is reducing, does not have much pain controlled with OTC meds.  Objective:  Physical Exam: no tenderness at the surgical site, local edema noted, and calf supple, nontender. Incision: healing well, no significant drainage, no dehiscence, no significant erythema   Assessment:   1. Post-operative state    Plan:  Patient was evaluated and treated and all questions answered.  Post-operative State -XR reviewed. Good positioning and maintained correction with intact hardware -NWB with crutches -Remaining sutures, staples removed. Steri strips applied. Ok to shower. ACE bandage applied for reduction of edema XRs needed at follow-up: 3 view Foot and 3 view Ankle  Return in about 3 weeks (around 03/23/2021) for Post-Op (with XRs).

## 2021-03-23 ENCOUNTER — Ambulatory Visit (INDEPENDENT_AMBULATORY_CARE_PROVIDER_SITE_OTHER): Payer: Medicaid Other

## 2021-03-23 ENCOUNTER — Other Ambulatory Visit: Payer: Self-pay

## 2021-03-23 ENCOUNTER — Ambulatory Visit (INDEPENDENT_AMBULATORY_CARE_PROVIDER_SITE_OTHER): Payer: Medicaid Other | Admitting: Podiatry

## 2021-03-23 DIAGNOSIS — Q6652 Congenital pes planus, left foot: Secondary | ICD-10-CM

## 2021-03-23 DIAGNOSIS — Z9889 Other specified postprocedural states: Secondary | ICD-10-CM

## 2021-03-23 NOTE — Progress Notes (Signed)
  Subjective:  Patient ID: Kevin Leonard, male    DOB: 13-Dec-2000,  MRN: [id number]  Chief Complaint  Patient presents with   Routine Post Op    POV #4DOS 01/27/2021 CORRECTION OF FLAT FOOT DEFORMITY   DOS: 01/27/21 Procedure: Flatfoot correction left foot with subtalar/talonavicular arthrodesis, removal of hardware   20 y.o. male presents with the above complaint. History confirmed with patient. Denies pain or post-op issues. Doing well with NWB with the crutches. Not taking any pain medications.  Objective:  Physical Exam: no tenderness at the surgical site, local edema noted, and calf supple, nontender. Incision: well healed  Assessment:   1. Congenital pes planus of left foot   2. Post-operative state    Plan:  Patient was evaluated and treated and all questions answered.  Post-operative State -XR reviewed. Arhtrodesis sites appear to be bridged. Can start WB with CAM boot. Patient to slowly transition WB. -Refer for PT. He is very weak and would benefit from ROM exercises and strengthening. -Will f/u in 3 weeks for new XR and plan to transition out of the boot.  XRs needed at follow-up: 3 view Foot and 3 view Ankle  Return in about 3 weeks (around 04/13/2021) for Post-Op (with XRs).

## 2021-04-08 ENCOUNTER — Other Ambulatory Visit: Payer: Self-pay

## 2021-04-08 ENCOUNTER — Encounter (HOSPITAL_COMMUNITY): Payer: Self-pay | Admitting: Physical Therapy

## 2021-04-08 ENCOUNTER — Ambulatory Visit (HOSPITAL_COMMUNITY): Payer: Medicaid Other | Attending: Podiatry | Admitting: Physical Therapy

## 2021-04-08 DIAGNOSIS — R262 Difficulty in walking, not elsewhere classified: Secondary | ICD-10-CM | POA: Diagnosis present

## 2021-04-08 DIAGNOSIS — M25675 Stiffness of left foot, not elsewhere classified: Secondary | ICD-10-CM | POA: Diagnosis present

## 2021-04-08 DIAGNOSIS — M25572 Pain in left ankle and joints of left foot: Secondary | ICD-10-CM | POA: Insufficient documentation

## 2021-04-08 NOTE — Therapy (Signed)
[facility] [address] [place], [place], [address] Phone: [phone]   Fax:  [phone]  Physical Therapy Evaluation  Patient Details  Name: Kevin Leonard MRN: [id number] Date of Birth: March 12, 2001 Referring Provider (PT): Ventura Sellers   Encounter Date: 04/08/2021   PT End of Session - 04/08/21 1043     Visit Number 1    Number of Visits 12    Date for PT Re-Evaluation 05/20/21    Authorization Type healthy blue    Progress Note Due on Visit 10    PT Start Time 1015    PT Stop Time 1045    PT Time Calculation (min) 30 min    Activity Tolerance Patient tolerated treatment well    Behavior During Therapy [facility] for tasks assessed/performed             Past Medical History:  Diagnosis Date   Pes planus, congenital    bilaterally    Past Surgical History:  Procedure Laterality Date   CALCANEAL OSTEOTOMY Left 12/20/2017   Procedure: [name] CALCANEAL OSTEOTOMY;  Surgeon: Park Liter, DPM;  Location: [facility];  Service: Podiatry;  Laterality: Left;   CALCANEAL OSTEOTOMY Right 02/28/2018   Procedure: [name] CALCANEAL OSTEOTOMY;  Surgeon: Park Liter, DPM;  Location: [facility];  Service: Podiatry;  Laterality: Right;   CALCANEAL OSTEOTOMY Left 07/25/2018   Procedure: COTTON TARSAL OSTEOTOMY AND [name] CALCANIAL OSTEOTOMY;  Surgeon: Park Liter, DPM;  Location: [facility];  Service: Podiatry;  Laterality: Left;   CALCANEAL OSTEOTOMY Left 04/10/2019   Procedure: CALCANEAL OSTECTOMY LEFT FOOT;  Surgeon: Park Liter, DPM;  Location: [facility];  Service: Podiatry;  Laterality: Left;   FLAT FOOT CORRECTION Left 01/27/2021   Procedure: FLAT FOOT CORRECTION;  Surgeon: Park Liter, DPM;  Location: [facility];  Service: Podiatry;  Laterality: Left;   FLAT FOOT RECONSTRUCTION-TAL GASTROC RECESSION Right 02/28/2018   Procedure: GASTROCNEMIUS  RECESSION;  Surgeon: Park Liter, DPM;  Location: [facility];  Service: Podiatry;  Laterality: Right;   GASTROC RECESSION EXTREMITY Left 12/20/2017   Procedure: GASTROC RECESSION EXTREMITY;  Surgeon: Park Liter, DPM;  Location: [facility];  Service: Podiatry;  Laterality: Left;   GRAFT APPLICATION Right 02/28/2018   Procedure: HEMI-CYLINDRICAL INTERCALARY ALLOGRAFT;  Surgeon: Park Liter, DPM;  Location: [facility];  Service: Podiatry;  Laterality: Right;   HARDWARE REMOVAL Left 07/25/2018   Procedure: HARDWARE REMOVAL;  Surgeon: Park Liter, DPM;  Location: [facility];  Service: Podiatry;  Laterality: Left;   INCISION AND DRAINAGE ABSCESS     to buttock at age of 2   METATARSAL OSTEOTOMY Left 12/20/2017   Procedure: METATARSAL OSTEOTOMY;  Surgeon: Park Liter, DPM;  Location: [facility];  Service: Podiatry;  Laterality: Left;   METATARSAL OSTEOTOMY Right 02/28/2018   Procedure: COTTON TARSAL OSTEOTOMY;  Surgeon: Park Liter, DPM;  Location: [facility];  Service: Podiatry;  Laterality: Right;   OSTECTOMY Left 12/20/2017   Procedure: OSTECTOMY;  Surgeon: Park Liter, DPM;  Location: [facility];  Service: Podiatry;  Laterality: Left;   OSTECTOMY Right 02/28/2018   Procedure: CALCANEAL OSTECTOMY;  Surgeon: Park Liter, DPM;  Location: [facility];  Service: Podiatry;  Laterality: Right;   TENDON LENGTHENING Left 04/10/2019   Procedure: [facility] ADVANCED TENDON LEFT FOOT;  Surgeon:  Park Liter, DPM;  Location: [facility];  Service: Podiatry;  Laterality: Left;    There were no vitals filed for this visit.    Subjective Assessment - 04/08/21 1015     Subjective Kevin Leonard states that three years ago his Lt foot turned purple.  He states that due to his congenital deformity he was not walking on his foot he was walking on his  ankle bones which cut off the circulation to his foot.   He had surgery on both feet  six months apart.  The right surgery was successful, however, to date he has had five surgeries on his left foot.   How long can you sit comfortably? no problem    How long can you stand comfortably? Pt will automatically put most of the wt on his right foot.    How long can you walk comfortably? Pt starts having pain after walking for 30 minutes with a crutch and a cam boot.    Patient Stated Goals to improve his mobility and strength.    Currently in Pain? Yes    Pain Score 3     Pain Location Ankle    Pain Orientation Left    Pain Descriptors / Indicators Aching    Pain Type Chronic pain    Pain Onset More than a month ago    Pain Frequency Intermittent    Aggravating Factors  walking    Pain Relieving Factors rest    Effect of Pain on Daily Activities limits                [facility] PT Assessment - 04/08/21 0001       Assessment   Medical Diagnosis s/p surgical correction for flat foot    Referring Provider (PT) Ventura Sellers    Onset Date/Surgical Date 01/27/21    Next MD Visit not sure    Prior Therapy not for this surgery      Precautions   Precautions None      Restrictions   Weight Bearing Restrictions Yes      Balance Screen   Has the patient fallen in the past 6 months No    Has the patient had a decrease in activity level because of a fear of falling?  Yes    Is the patient reluctant to leave their home because of a fear of falling?  No      Home Environment   Living Environment Private residence    Type of Home House    Home Access Stairs to enter   no problem     Prior Function   Level of Independence Independent    Vocation Unemployed    Leisure [employer] boarding , racing RC cars, playing basketball      Cognition   Overall Cognitive Status Within Functional Limits for tasks assessed      ROM / Strength   AROM / PROM / Strength AROM;Strength      AROM   AROM  Assessment Site Ankle    Right/Left Ankle Left    Left Ankle Dorsiflexion -2    Left Ankle Plantar Flexion 38    Left Ankle Inversion -2    Left Ankle Eversion 5      Strength   Strength Assessment Site Hip;Knee;Ankle    Right/Left Hip Left    Left Hip Flexion 4+/5    Left Hip Extension 4+/5    Left Hip ABduction 4+/5    Right/Left Knee Left  Left Knee Flexion 4+/5    Left Knee Extension 4/5    Right/Left Ankle Left    Left Ankle Dorsiflexion 3/5    Left Ankle Plantar Flexion 2/5    Left Ankle Inversion 2+/5    Left Ankle Eversion 2+/5                        Objective measurements completed on examination: See above findings.       [facility] Adult PT Treatment/Exercise - 04/08/21 0001       Exercises   Exercises Ankle      Ankle Exercises: Seated   Towel Crunch 3 reps      Ankle Exercises: Supine   Other Supine Ankle Exercises ROM all x 10 hold 5 secnd                     PT Education - 04/08/21 1145     Education Details HEP    Person(s) Educated Patient    Methods Explanation;Handout    Comprehension Verbalized understanding;Returned demonstration              PT Short Term Goals - 04/08/21 1155       PT SHORT TERM GOAL #1   Title PT to be I in HEP in order to improve ROM and strength  of Lt ankle.    Time 3    Period Weeks    Status New    Target Date 04/29/21      PT SHORT TERM GOAL #2   Title Pt to be walking with camboot without crutches.    Time 3    Period Weeks    Status New      PT SHORT TERM GOAL #3   Title Pt able to ambulate for an hour without increased pain.    Time 3    Period Weeks    Status New               PT Long Term Goals - 04/08/21 1157       PT LONG TERM GOAL #1   Title Pt to be I in advance HEP in order to increase strength in his Lt LE to be able to walk without his cam boot.    Time 6    Period Weeks    Status New    Target Date 05/20/21      PT LONG TERM GOAL #2   Title  Pt pain to be no greater than a 2/10 after standing/walking for an hour and a half to allow community activity participation.    Time 6    Period Weeks    Status New      PT LONG TERM GOAL #3   Title PT strength of Lt ankle to be a 4/5 so that pt is able to place wt equally on both his Rt and his Lt foot when standing greater than 15 minutes.    Time 6    Period Weeks    Status New                    Plan - 04/08/21 1147     Clinical Impression Statement Kevin Leonard is an 20 yo male who had reconsturctive surgery on his Lt foot for congenital pes planus; this was his fifth surgery on this foot. He currently is walking with one crutch and a camboot.  He has been referred to skilled physical therapy  to improve his motion and strength. Evaluation demonstrates significant decreased ROM and strength, decreased activity tolerance and increased pain.  Kevin Leonard will benefit from skilled PT to address these deficits and improve his functional ability.    Personal Factors and Comorbidities Comorbidity 3+;Finances;Time since onset of injury/illness/exacerbation    Comorbidities 5th surgery on foot    Examination-Activity Limitations Lift;Locomotion Level;Carry;Squat;Stand    Examination-Participation Restrictions Community Activity;Shop    Stability/Clinical Decision Making Evolving/Moderate complexity    Clinical Decision Making Moderate    Rehab Potential Good    PT Frequency 2x / week    PT Duration 6 weeks    PT Treatment/Interventions Gait training;Therapeutic activities;Therapeutic exercise;Balance training;Patient/family education;Manual techniques;Passive range of motion    PT Next Visit Plan begin isometric exercises as a HEP, sitting baps, towel in/eversion, heel/toe raises, sitting heelslide to improve DF, progress to marble pickup, sidelying in/eversion.    PT Home Exercise Plan supine ankle ROM, towel crunch             Patient will benefit from skilled therapeutic  intervention in order to improve the following deficits and impairments:  Decreased activity tolerance, Decreased range of motion, Difficulty walking, Decreased strength, Pain, Hypomobility, Increased edema  Visit Diagnosis: Pain in left ankle and joints of left foot - Plan: PT plan of care cert/re-cert  Stiffness of left foot, not elsewhere classified - Plan: PT plan of care cert/re-cert  Difficulty in walking, not elsewhere classified - Plan: PT plan of care cert/re-cert     Problem List Patient Active Problem List   Diagnosis Date Noted   Acquired pes planus of left foot    Bony exostosis    Retained orthopedic hardware    Pes planus of right foot    Acquired equinus deformity of right foot    Virgina Organ, PT CLT [phone] , PT 04/08/2021, 12:06 PM  [facility] [address] [place], [place], [address] Phone: [phone]   Fax:  [phone]  Name: Kevin Leonard MRN: [id number] Date of Birth: 2001/04/07

## 2021-04-09 ENCOUNTER — Telehealth (HOSPITAL_COMMUNITY): Payer: Self-pay | Admitting: Physical Therapy

## 2021-04-09 NOTE — Telephone Encounter (Signed)
(  Grandma called she can not bring him 2xwk she can only bring him once a week due to work schedule)

## 2021-04-14 ENCOUNTER — Other Ambulatory Visit: Payer: Self-pay

## 2021-04-14 ENCOUNTER — Ambulatory Visit (HOSPITAL_COMMUNITY): Payer: Medicaid Other

## 2021-04-14 DIAGNOSIS — M25572 Pain in left ankle and joints of left foot: Secondary | ICD-10-CM | POA: Diagnosis not present

## 2021-04-14 DIAGNOSIS — M25675 Stiffness of left foot, not elsewhere classified: Secondary | ICD-10-CM

## 2021-04-14 DIAGNOSIS — R262 Difficulty in walking, not elsewhere classified: Secondary | ICD-10-CM

## 2021-04-14 NOTE — Patient Instructions (Signed)
Access Code: [email or web address] URL: [email or web address] Date: 04/14/2021 Prepared by: Shary Decamp  Exercises Seated Ankle Eversion with Resistance - 1 x daily - 7 x weekly - 3 sets - 10 reps Seated Ankle Dorsiflexion with Resistance - 1 x daily - 7 x weekly - 3 sets - 10 reps Great Toe Flexion with Resistance - 1 x daily - 7 x weekly - 3 sets - 10 reps Long Sitting Calf Stretch with Strap - 1 x daily - 7 x weekly - 3-5 sets - 60 sec hold

## 2021-04-14 NOTE — Therapy (Signed)
[facility] [address] [place], [place], [address] Phone: [phone]   Fax:  [phone]  Physical Therapy Treatment  Patient Details  Name: Kevin Leonard MRN: [id number] Date of Birth: 08-Sep-2000 Referring Provider (PT): Ventura Sellers   Encounter Date: 04/14/2021   PT End of Session - 04/14/21 1600     Visit Number 2    Number of Visits 12    Date for PT Re-Evaluation 05/20/21    Authorization Type healthy blue    Progress Note Due on Visit 10    PT Start Time 1600    PT Stop Time 1645    PT Time Calculation (min) 45 min    Activity Tolerance Patient tolerated treatment well    Behavior During Therapy [facility] for tasks assessed/performed             Past Medical History:  Diagnosis Date   Pes planus, congenital    bilaterally    Past Surgical History:  Procedure Laterality Date   CALCANEAL OSTEOTOMY Left 12/20/2017   Procedure: [name] CALCANEAL OSTEOTOMY;  Surgeon: Park Liter, DPM;  Location: [facility];  Service: Podiatry;  Laterality: Left;   CALCANEAL OSTEOTOMY Right 02/28/2018   Procedure: [name] CALCANEAL OSTEOTOMY;  Surgeon: Park Liter, DPM;  Location: [facility];  Service: Podiatry;  Laterality: Right;   CALCANEAL OSTEOTOMY Left 07/25/2018   Procedure: COTTON TARSAL OSTEOTOMY AND [name] CALCANIAL OSTEOTOMY;  Surgeon: Park Liter, DPM;  Location: [facility];  Service: Podiatry;  Laterality: Left;   CALCANEAL OSTEOTOMY Left 04/10/2019   Procedure: CALCANEAL OSTECTOMY LEFT FOOT;  Surgeon: Park Liter, DPM;  Location: [facility];  Service: Podiatry;  Laterality: Left;   FLAT FOOT CORRECTION Left 01/27/2021   Procedure: FLAT FOOT CORRECTION;  Surgeon: Park Liter, DPM;  Location: [facility];  Service: Podiatry;  Laterality: Left;   FLAT FOOT RECONSTRUCTION-TAL GASTROC RECESSION Right 02/28/2018   Procedure: GASTROCNEMIUS  RECESSION;  Surgeon: Park Liter, DPM;  Location: [facility];  Service: Podiatry;  Laterality: Right;   GASTROC RECESSION EXTREMITY Left 12/20/2017   Procedure: GASTROC RECESSION EXTREMITY;  Surgeon: Park Liter, DPM;  Location: [facility];  Service: Podiatry;  Laterality: Left;   GRAFT APPLICATION Right 02/28/2018   Procedure: HEMI-CYLINDRICAL INTERCALARY ALLOGRAFT;  Surgeon: Park Liter, DPM;  Location: [facility];  Service: Podiatry;  Laterality: Right;   HARDWARE REMOVAL Left 07/25/2018   Procedure: HARDWARE REMOVAL;  Surgeon: Park Liter, DPM;  Location: [facility];  Service: Podiatry;  Laterality: Left;   INCISION AND DRAINAGE ABSCESS     to buttock at age of 2   METATARSAL OSTEOTOMY Left 12/20/2017   Procedure: METATARSAL OSTEOTOMY;  Surgeon: Park Liter, DPM;  Location: [facility];  Service: Podiatry;  Laterality: Left;   METATARSAL OSTEOTOMY Right 02/28/2018   Procedure: COTTON TARSAL OSTEOTOMY;  Surgeon: Park Liter, DPM;  Location: [facility];  Service: Podiatry;  Laterality: Right;   OSTECTOMY Left 12/20/2017   Procedure: OSTECTOMY;  Surgeon: Park Liter, DPM;  Location: [facility];  Service: Podiatry;  Laterality: Left;   OSTECTOMY Right 02/28/2018   Procedure: CALCANEAL OSTECTOMY;  Surgeon: Park Liter, DPM;  Location:  [facility];  Service: Podiatry;  Laterality: Right;   TENDON LENGTHENING Left 04/10/2019   Procedure: [facility] ADVANCED TENDON LEFT FOOT;  Surgeon:  Park Liter, DPM;  Location: [facility];  Service: Podiatry;  Laterality: Left;    There were no vitals filed for this visit.   Subjective Assessment - 04/14/21 1603     Subjective No issues since last session. No swelling and denies any issues of numbess/tingling    How long can you sit comfortably? no problem    How long can you stand  comfortably? Pt will automatically put most of the wt on his right foot.    How long can you walk comfortably? Pt starts having pain after walking for 30 minutes with a crutch and a cam boot.    Patient Stated Goals to improve his mobility and strength.    Pain Onset More than a month ago                               [facility] Adult PT Treatment/Exercise - 04/14/21 0001       Ankle Exercises: Seated   Towel Crunch Other (comment)   x 2.5 min for warm-up   BAPS Sitting;Level 2   clockwise/counter-clockwise x 1.5 min ea   Other Seated Ankle Exercises Resisted eversion and DF with red t-loop 3x10. Great toe plantarflexion w/ yellow t-band 3x10 (no flexion of toe)                     PT Education - 04/14/21 1643     Education Details education/demonstration of toe spreaders to static stretch for toe abduction/splaying    Person(s) Educated Patient    Methods Explanation    Comprehension Verbalized understanding              PT Short Term Goals - 04/08/21 1155       PT SHORT TERM GOAL #1   Title PT to be I in HEP in order to improve ROM and strength  of Lt ankle.    Time 3    Period Weeks    Status New    Target Date 04/29/21      PT SHORT TERM GOAL #2   Title Pt to be walking with camboot without crutches.    Time 3    Period Weeks    Status New      PT SHORT TERM GOAL #3   Title Pt able to ambulate for an hour without increased pain.    Time 3    Period Weeks    Status New               PT Long Term Goals - 04/08/21 1157       PT LONG TERM GOAL #1   Title Pt to be I in advance HEP in order to increase strength in his Lt LE to be able to walk without his cam boot.    Time 6    Period Weeks    Status New    Target Date 05/20/21      PT LONG TERM GOAL #2   Title Pt pain to be no greater than a 2/10 after standing/walking for an hour and a half to allow community activity participation.    Time 6    Period Weeks    Status  New      PT LONG TERM GOAL #3   Title PT strength of Lt ankle to be a 4/5 so that pt is able to place wt equally on both his Rt and  his Lt foot when standing greater than 15 minutes.    Time 6    Period Weeks    Status New                   Plan - 04/14/21 1642     Clinical Impression Statement Doing well with preliminary seated strengthening for foot intrinsics using t-band. Pt denies any pain or parasthesias and demonstraing good activation of major muscle groups. Continued sessions indicated to progress foot/ankle strengthening and progress activities at surgeon's discretion    Personal Factors and Comorbidities Comorbidity 3+;Finances;Time since onset of injury/illness/exacerbation    Comorbidities 5th surgery on foot    Examination-Activity Limitations Lift;Locomotion Level;Carry;Squat;Stand    Examination-Participation Restrictions Community Activity;Shop    Stability/Clinical Decision Making Evolving/Moderate complexity    Rehab Potential Good    PT Frequency 2x / week    PT Duration 6 weeks    PT Treatment/Interventions Gait training;Therapeutic activities;Therapeutic exercise;Balance training;Patient/family education;Manual techniques;Passive range of motion    PT Next Visit Plan begin isometric exercises as a HEP, sitting baps, towel in/eversion, heel/toe raises, sitting heelslide to improve DF, progress to marble pickup, sidelying in/eversion.    PT Home Exercise Plan supine ankle ROM, towel crunch             Patient will benefit from skilled therapeutic intervention in order to improve the following deficits and impairments:  Decreased activity tolerance, Decreased range of motion, Difficulty walking, Decreased strength, Pain, Hypomobility, Increased edema  Visit Diagnosis: Pain in left ankle and joints of left foot  Stiffness of left foot, not elsewhere classified  Difficulty in walking, not elsewhere classified     Problem List Patient Active  Problem List   Diagnosis Date Noted   Acquired pes planus of left foot    Bony exostosis    Retained orthopedic hardware    Pes planus of right foot    Acquired equinus deformity of right foot     Dion Body, PT 04/14/2021, 4:45 PM  [facility] [address] [place], [place], [address] Phone: [phone]   Fax:  [phone]  Name: VICTOR LANGENBACH MRN: [id number] Date of Birth: 02/18/2001

## 2021-04-15 ENCOUNTER — Encounter (HOSPITAL_COMMUNITY): Payer: Medicaid Other | Admitting: Physical Therapy

## 2021-04-20 ENCOUNTER — Ambulatory Visit (INDEPENDENT_AMBULATORY_CARE_PROVIDER_SITE_OTHER): Payer: Medicaid Other | Admitting: Podiatry

## 2021-04-20 ENCOUNTER — Encounter (HOSPITAL_COMMUNITY): Payer: Self-pay | Admitting: Physical Therapy

## 2021-04-20 ENCOUNTER — Other Ambulatory Visit: Payer: Self-pay

## 2021-04-20 ENCOUNTER — Ambulatory Visit (INDEPENDENT_AMBULATORY_CARE_PROVIDER_SITE_OTHER): Payer: Medicaid Other

## 2021-04-20 ENCOUNTER — Ambulatory Visit (HOSPITAL_COMMUNITY): Payer: Medicaid Other | Admitting: Physical Therapy

## 2021-04-20 DIAGNOSIS — Z9889 Other specified postprocedural states: Secondary | ICD-10-CM

## 2021-04-20 DIAGNOSIS — R262 Difficulty in walking, not elsewhere classified: Secondary | ICD-10-CM

## 2021-04-20 DIAGNOSIS — M25572 Pain in left ankle and joints of left foot: Secondary | ICD-10-CM

## 2021-04-20 DIAGNOSIS — M25675 Stiffness of left foot, not elsewhere classified: Secondary | ICD-10-CM

## 2021-04-20 NOTE — Patient Instructions (Signed)
Access Code: [email or web address] URL: [email or web address] Date: 04/20/2021 Prepared by: [facility] Ivana Nicastro  Exercises Isometric Ankle Eversion at [employer] - 1 x daily - 7 x weekly - 5 reps - 10 second hold Isometric Ankle Inversion at Wall - 1 x daily - 7 x weekly - 5 reps - 10 second hold Isometric Ankle Dorsiflexion and Plantarflexion - 1 x daily - 7 x weekly - 5 reps - 10 second hold Seated Isometric Ankle Plantarflexion - 1 x daily - 7 x weekly - 5 reps - 10 second hold

## 2021-04-20 NOTE — Therapy (Signed)
[facility] [address] [place], [place], [address] Phone: [phone]   Fax:  [phone]  Physical Therapy Treatment  Patient Details  Name: Kevin Leonard MRN: [id number] Date of Birth: 22-Feb-2001 Referring Provider (PT): Ventura Sellers   Encounter Date: 04/20/2021   PT End of Session - 04/20/21 0749     Visit Number 3    Number of Visits 12    Date for PT Re-Evaluation 05/20/21    Authorization Type healthy blue    Progress Note Due on Visit 10    PT Start Time 0748    PT Stop Time 0826    PT Time Calculation (min) 38 min    Activity Tolerance Patient tolerated treatment well    Behavior During Therapy [facility] for tasks assessed/performed             Past Medical History:  Diagnosis Date   Pes planus, congenital    bilaterally    Past Surgical History:  Procedure Laterality Date   CALCANEAL OSTEOTOMY Left 12/20/2017   Procedure: [name] CALCANEAL OSTEOTOMY;  Surgeon: Park Liter, DPM;  Location: [facility];  Service: Podiatry;  Laterality: Left;   CALCANEAL OSTEOTOMY Right 02/28/2018   Procedure: [name] CALCANEAL OSTEOTOMY;  Surgeon: Park Liter, DPM;  Location: [facility];  Service: Podiatry;  Laterality: Right;   CALCANEAL OSTEOTOMY Left 07/25/2018   Procedure: COTTON TARSAL OSTEOTOMY AND [name] CALCANIAL OSTEOTOMY;  Surgeon: Park Liter, DPM;  Location: [facility];  Service: Podiatry;  Laterality: Left;   CALCANEAL OSTEOTOMY Left 04/10/2019   Procedure: CALCANEAL OSTECTOMY LEFT FOOT;  Surgeon: Park Liter, DPM;  Location: [facility];  Service: Podiatry;  Laterality: Left;   FLAT FOOT CORRECTION Left 01/27/2021   Procedure: FLAT FOOT CORRECTION;  Surgeon: Park Liter, DPM;  Location: [facility] ;  Service: Podiatry;  Laterality: Left;   FLAT FOOT RECONSTRUCTION-TAL GASTROC RECESSION Right 02/28/2018   Procedure: GASTROCNEMIUS  RECESSION;  Surgeon: Park Liter, DPM;  Location: [facility];  Service: Podiatry;  Laterality: Right;   GASTROC RECESSION EXTREMITY Left 12/20/2017   Procedure: GASTROC RECESSION EXTREMITY;  Surgeon: Park Liter, DPM;  Location: [facility];  Service: Podiatry;  Laterality: Left;   GRAFT APPLICATION Right 02/28/2018   Procedure: HEMI-CYLINDRICAL INTERCALARY ALLOGRAFT;  Surgeon: Park Liter, DPM;  Location: [facility];  Service: Podiatry;  Laterality: Right;   HARDWARE REMOVAL Left 07/25/2018   Procedure: HARDWARE REMOVAL;  Surgeon: Park Liter, DPM;  Location: [facility];  Service: Podiatry;  Laterality: Left;   INCISION AND DRAINAGE ABSCESS     to buttock at age of 2   METATARSAL OSTEOTOMY Left 12/20/2017   Procedure: METATARSAL OSTEOTOMY;  Surgeon: Park Liter, DPM;  Location: [facility];  Service: Podiatry;  Laterality: Left;   METATARSAL OSTEOTOMY Right 02/28/2018   Procedure: COTTON TARSAL OSTEOTOMY;  Surgeon: Park Liter, DPM;  Location: [facility];  Service: Podiatry;  Laterality: Right;   OSTECTOMY Left 12/20/2017   Procedure: OSTECTOMY;  Surgeon: Park Liter, DPM;  Location: [facility];  Service: Podiatry;  Laterality: Left;   OSTECTOMY Right 02/28/2018   Procedure: CALCANEAL OSTECTOMY;  Surgeon: Park Liter, DPM;  Location: [facility];  Service: Podiatry;  Laterality: Right;   TENDON LENGTHENING Left 04/10/2019   Procedure: [facility] ADVANCED TENDON LEFT FOOT;  Surgeon:  Park Liter, DPM;  Location: [facility];  Service: Podiatry;  Laterality: Left;    There were no vitals filed for this visit.   Subjective Assessment - 04/20/21 0750     Subjective Patient states no new issue. his exercises are going alright.    How long can you sit comfortably? no problem    How long can you stand comfortably? Pt will  automatically put most of the wt on his right foot.    How long can you walk comfortably? Pt starts having pain after walking for 30 minutes with a crutch and a cam boot.    Patient Stated Goals to improve his mobility and strength.    Currently in Pain? No/denies    Pain Onset More than a month ago                               [facility] Adult PT Treatment/Exercise - 04/20/21 0001       Ankle Exercises: Seated   Towel Crunch 1 rep   2 minutes   Towel Inversion/Eversion 1 rep   2 minutes   Heel Raises Left;15 reps   2 sets   Toe Raise 15 reps   2 sets   BAPS Sitting;Level 2   CW, CCW, DF/PF, INV/EV 1.5 minutes each   Heel Slides Left;10 reps   10 second holds   Other Seated Ankle Exercises ankle isometrics 5 x 10 second holds each; rockerboard PF/DF 2 minutes      Ankle Exercises: Standing   Other Standing Ankle Exercises squat 2x 10                     PT Education - 04/20/21 0750     Education Details HEP    Person(s) Educated Patient    Methods Explanation    Comprehension Verbalized understanding              PT Short Term Goals - 04/08/21 1155       PT SHORT TERM GOAL #1   Title PT to be I in HEP in order to improve ROM and strength  of Lt ankle.    Time 3    Period Weeks    Status New    Target Date 04/29/21      PT SHORT TERM GOAL #2   Title Pt to be walking with camboot without crutches.    Time 3    Period Weeks    Status New      PT SHORT TERM GOAL #3   Title Pt able to ambulate for an hour without increased pain.    Time 3    Period Weeks    Status New               PT Long Term Goals - 04/08/21 1157       PT LONG TERM GOAL #1   Title Pt to be I in advance HEP in order to increase strength in his Lt LE to be able to walk without his cam boot.    Time 6    Period Weeks    Status New    Target Date 05/20/21      PT LONG TERM GOAL #2   Title Pt pain to be no greater than a 2/10 after standing/walking for  an hour and a half to allow community activity participation.    Time 6  Period Weeks    Status New      PT LONG TERM GOAL #3   Title PT strength of Lt ankle to be a 4/5 so that pt is able to place wt equally on both his Rt and his Lt foot when standing greater than 15 minutes.    Time 6    Period Weeks    Status New                   Plan - 04/20/21 0749     Clinical Impression Statement Began session with towel exercises for warm up. Continued with L ankle AROM and initiated isometrics which are tolerated well. Patient unable to complete sit to/from standing without UE support secondary to impaired bilateral LE strength. Began squats with boot and cueing for equal weight bearing but he requires bilateral UE assist. Patient will continue to benefit from skilled physical therapy in order to reduce impairment and improve function.    Personal Factors and Comorbidities Comorbidity 3+;Finances;Time since onset of injury/illness/exacerbation    Comorbidities 5th surgery on foot    Examination-Activity Limitations Lift;Locomotion Level;Carry;Squat;Stand    Examination-Participation Restrictions Community Activity;Shop    Stability/Clinical Decision Making Evolving/Moderate complexity    Rehab Potential Good    PT Frequency 2x / week    PT Duration 6 weeks    PT Treatment/Interventions Gait training;Therapeutic activities;Therapeutic exercise;Balance training;Patient/family education;Manual techniques;Passive range of motion    PT Next Visit Plan Continue ankle AROM and strength and progress as toelrated. progress to marble pickup, sidelying in/eversion.    PT Home Exercise Plan supine ankle ROM, towel crunch 10/18 ankle iso             Patient will benefit from skilled therapeutic intervention in order to improve the following deficits and impairments:  Decreased activity tolerance, Decreased range of motion, Difficulty walking, Decreased strength, Pain, Hypomobility,  Increased edema  Visit Diagnosis: Pain in left ankle and joints of left foot  Stiffness of left foot, not elsewhere classified  Difficulty in walking, not elsewhere classified     Problem List Patient Active Problem List   Diagnosis Date Noted   Acquired pes planus of left foot    Bony exostosis    Retained orthopedic hardware    Pes planus of right foot    Acquired equinus deformity of right foot     8:27 AM, 04/20/21 Wyman Songster PT, DPT Physical Therapist at [facility]   [facility] [address] [place], [place], [address] Phone: [phone]   Fax:  [phone]  Name: Kevin Leonard MRN: [id number] Date of Birth: [birth year]

## 2021-04-26 ENCOUNTER — Encounter (HOSPITAL_COMMUNITY): Payer: Medicaid Other | Admitting: Physical Therapy

## 2021-04-27 NOTE — Progress Notes (Signed)
  Subjective:  Patient ID: MONTRE HARBOR, male    DOB: [birth year],  MRN: [id number]  Chief Complaint  Patient presents with   Routine Post Op    Post op left foot. Pt states he is doing well. No questions or concerns.    DOS: 01/27/21 Procedure: Flatfoot correction left foot with subtalar/talonavicular arthrodesis, removal of hardware   20 y.o. male presents with the above complaint. History confirmed with patient. Doing well overall feels like he is getting stronger with PT.  Objective:  Physical Exam: no tenderness at the surgical site, local edema noted, and calf supple, nontender. Incision: well healed  Assessment:   1. Post-operative state    Plan:  Patient was evaluated and treated and all questions answered.  Post-operative State -XR reviewed. Full bridging noted. -WBAT in CAM Boot but transition to ankle brace. -Continue PT  XRs needed at follow-up: 3 view Foot and 3 view Ankle  Return in about 1 month (around 05/21/2021) for Post-Op (No XRs).

## 2021-04-28 ENCOUNTER — Other Ambulatory Visit: Payer: Self-pay

## 2021-04-28 ENCOUNTER — Ambulatory Visit (HOSPITAL_COMMUNITY): Payer: Medicaid Other | Admitting: Physical Therapy

## 2021-04-28 ENCOUNTER — Encounter (HOSPITAL_COMMUNITY): Payer: Self-pay | Admitting: Physical Therapy

## 2021-04-28 DIAGNOSIS — R262 Difficulty in walking, not elsewhere classified: Secondary | ICD-10-CM

## 2021-04-28 DIAGNOSIS — M25572 Pain in left ankle and joints of left foot: Secondary | ICD-10-CM | POA: Diagnosis not present

## 2021-04-28 DIAGNOSIS — M25675 Stiffness of left foot, not elsewhere classified: Secondary | ICD-10-CM

## 2021-04-28 NOTE — Therapy (Signed)
[facility] [address] [place], [place], [address] Phone: [phone]   Fax:  [phone]  Physical Therapy Treatment  Patient Details  Name: Kevin Leonard MRN: [id number] Date of Birth: October 09, 2000 Referring Provider (PT): Ventura Sellers   Encounter Date: 04/28/2021   PT End of Session - 04/28/21 1004     Visit Number 4    Number of Visits 12    Date for PT Re-Evaluation 05/20/21    Authorization Type healthy blue    Authorization Time Period 12 visits 10/12-11/23/22    Authorization - Visit Number 4    Authorization - Number of Visits 12    Progress Note Due on Visit 10    PT Start Time 0958   late arrival   PT Stop Time 1027    PT Time Calculation (min) 29 min    Activity Tolerance Patient tolerated treatment well    Behavior During Therapy [facility] for tasks assessed/performed             Past Medical History:  Diagnosis Date   Pes planus, congenital    bilaterally    Past Surgical History:  Procedure Laterality Date   CALCANEAL OSTEOTOMY Left 12/20/2017   Procedure: [name] CALCANEAL OSTEOTOMY;  Surgeon: Park Liter, DPM;  Location: [facility];  Service: Podiatry;  Laterality: Left;   CALCANEAL OSTEOTOMY Right 02/28/2018   Procedure: [name] CALCANEAL OSTEOTOMY;  Surgeon: Park Liter, DPM;  Location: [facility];  Service: Podiatry;  Laterality: Right;   CALCANEAL OSTEOTOMY Left 07/25/2018   Procedure: COTTON TARSAL OSTEOTOMY AND [name] CALCANIAL OSTEOTOMY;  Surgeon: Park Liter, DPM;  Location: [facility];  Service: Podiatry;  Laterality: Left;   CALCANEAL OSTEOTOMY Left 04/10/2019   Procedure: CALCANEAL OSTECTOMY LEFT FOOT;  Surgeon: Park Liter, DPM;  Location: [facility];  Service: Podiatry;  Laterality: Left;   FLAT FOOT CORRECTION Left 01/27/2021   Procedure: FLAT FOOT CORRECTION;  Surgeon: Park Liter, DPM;  [facility];  Service: Podiatry;  Laterality: Left;   FLAT FOOT RECONSTRUCTION-TAL GASTROC RECESSION Right 02/28/2018   Procedure: GASTROCNEMIUS RECESSION;  Surgeon: Park Liter, DPM;  Location: [facility];  Service: Podiatry;  Laterality: Right;   GASTROC RECESSION EXTREMITY Left 12/20/2017   Procedure: GASTROC RECESSION EXTREMITY;  Surgeon: Park Liter, DPM;  Location: [facility];  Service: Podiatry;  Laterality: Left;   GRAFT APPLICATION Right 02/28/2018   Procedure: HEMI-CYLINDRICAL INTERCALARY ALLOGRAFT;  Surgeon: Park Liter, DPM;  Location: [facility];  Service: Podiatry;  Laterality: Right;   HARDWARE REMOVAL Left 07/25/2018   Procedure: HARDWARE REMOVAL;  Surgeon: Park Liter, DPM;  Location: [facility];  Service: Podiatry;  Laterality: Left;   INCISION AND DRAINAGE ABSCESS     to buttock at age of 2   METATARSAL OSTEOTOMY Left 12/20/2017   Procedure: METATARSAL OSTEOTOMY;  Surgeon: Park Liter, DPM;  Location: [facility];  Service: Podiatry;  Laterality: Left;   METATARSAL OSTEOTOMY Right 02/28/2018   Procedure: COTTON TARSAL OSTEOTOMY;  Surgeon: Park Liter, DPM;  Location:  SURGERY CENTER;  Service: Podiatry;  Laterality: Right;   OSTECTOMY Left 12/20/2017   Procedure: OSTECTOMY;  Surgeon: Park Liter, DPM;  Location:  SURGERY CENTER;  Service: Podiatry;  Laterality: Left;   OSTECTOMY Right 02/28/2018   Procedure: CALCANEAL OSTECTOMY;  Surgeon: Park Liter,  DPM;  Location: [facility];  Service: Podiatry;  Laterality: Right;   TENDON LENGTHENING Left 04/10/2019   Procedure: [facility] ADVANCED TENDON LEFT FOOT;  Surgeon: Park Liter, DPM;  Location: [facility];  Service: Podiatry;  Laterality: Left;    There were no vitals filed for this visit.   Subjective Assessment - 04/28/21 1003     Subjective Patient says he is  doing good. Is wearing shoes now, out of boot, this is going well.    How long can you sit comfortably? no problem    How long can you stand comfortably? Pt will automatically put most of the wt on his right foot.    How long can you walk comfortably? Pt starts having pain after walking for 30 minutes with a crutch and a cam boot.    Patient Stated Goals to improve his mobility and strength.    Currently in Pain? No/denies    Pain Onset More than a month ago                               [facility] Adult PT Treatment/Exercise - 04/28/21 0001       Ankle Exercises: Seated   Other Seated Ankle Exercises GTB ankle 3 way x20 each      Ankle Exercises: Standing   Rocker Board 2 minutes   DF/ PF   Other Standing Ankle Exercises attempted heel raise and toe raise, too difficult due to weakness    Other Standing Ankle Exercises weight shift lateral, FWD 10 x 5" each, tandem stance 2 x 30" each                       PT Short Term Goals - 04/08/21 1155       PT SHORT TERM GOAL #1   Title PT to be I in HEP in order to improve ROM and strength  of Lt ankle.    Time 3    Period Weeks    Status New    Target Date 04/29/21      PT SHORT TERM GOAL #2   Title Pt to be walking with camboot without crutches.    Time 3    Period Weeks    Status New      PT SHORT TERM GOAL #3   Title Pt able to ambulate for an hour without increased pain.    Time 3    Period Weeks    Status New               PT Long Term Goals - 04/08/21 1157       PT LONG TERM GOAL #1   Title Pt to be I in advance HEP in order to increase strength in his Lt LE to be able to walk without his cam boot.    Time 6    Period Weeks    Status New    Target Date 05/20/21      PT LONG TERM GOAL #2   Title Pt pain to be no greater than a 2/10 after standing/walking for an hour and a half to allow community activity participation.    Time 6    Period Weeks    Status New      PT LONG  TERM GOAL #3   Title PT strength of Lt ankle to be a 4/5 so that pt is able  to place wt equally on both his Rt and his Lt foot when standing greater than 15 minutes.    Time 6    Period Weeks    Status New                   Plan - 04/28/21 1030     Clinical Impression Statement Patient tolerated session well. Progressed WB exercise with no increased complaint of pain. Patient was challenged with standing heel raise and toe raise due to weakness. Added resisted ankle 4 way to improve ankle strength, patient educated on proper form and function. Issued updated HEP. Patient will continue to benefit from skilled therapy services to progress ankle strength and stability for reduced pain and improved functional mobility.    Personal Factors and Comorbidities Comorbidity 3+;Finances;Time since onset of injury/illness/exacerbation    Comorbidities 5th surgery on foot    Examination-Activity Limitations Lift;Locomotion Level;Carry;Squat;Stand    Examination-Participation Restrictions Community Activity;Shop    Stability/Clinical Decision Making Evolving/Moderate complexity    Rehab Potential Good    PT Frequency 2x / week    PT Duration 6 weeks    PT Treatment/Interventions Gait training;Therapeutic activities;Therapeutic exercise;Balance training;Patient/family education;Manual techniques;Passive range of motion    PT Next Visit Plan Continue ankle AROM and strength and progress as toelrated. Standing activity, balance    PT Home Exercise Plan supine ankle ROM, towel crunch 10/18 ankle iso 10/26 ankle 4 way, weight shifts    Consulted and Agree with Plan of Care Patient             Patient will benefit from skilled therapeutic intervention in order to improve the following deficits and impairments:  Decreased activity tolerance, Decreased range of motion, Difficulty walking, Decreased strength, Pain, Hypomobility, Increased edema  Visit Diagnosis: Pain in left ankle and joints of  left foot  Stiffness of left foot, not elsewhere classified  Difficulty in walking, not elsewhere classified     Problem List Patient Active Problem List   Diagnosis Date Noted   Acquired pes planus of left foot    Bony exostosis    Retained orthopedic hardware    Pes planus of right foot    Acquired equinus deformity of right foot    10:32 AM, 04/28/21 Georges Lynch PT DPT  Physical Therapist with [facility]  [phone]   [facility] [place] [address] [place], [place], [address] Phone: [phone]   Fax:  [phone]  Name: Kevin Leonard MRN: [id number] Date of Birth: April 13, 2001

## 2021-04-28 NOTE — Patient Instructions (Signed)
Access Code: [email or web address] URL: [email or web address] Date: 04/28/2021 Prepared by: Georges Lynch  Exercises Seated Ankle Inversion with Resistance and Legs Crossed - 3 x daily - 7 x weekly - 3 sets - 10 reps Seated Ankle Eversion with Resistance - 3 x daily - 7 x weekly - 3 sets - 10 reps Seated Ankle Plantarflexion with Resistance - 3 x daily - 7 x weekly - 3 sets - 10 reps Seated Ankle Dorsiflexion with Resistance - 3 x daily - 7 x weekly - 3 sets - 10 reps Side to Side Weight Shift with Counter Support - 3 x daily - 7 x weekly - 1 sets - 10 reps - 5 second hold Staggered Stance Forward Backward Weight Shift with Unilateral Counter Support - 3 x daily - 7 x weekly - 1 sets - 10 reps - 5 second hold

## 2021-05-03 ENCOUNTER — Encounter (HOSPITAL_COMMUNITY): Payer: Medicaid Other | Admitting: Physical Therapy

## 2021-05-05 ENCOUNTER — Ambulatory Visit (HOSPITAL_COMMUNITY): Payer: Medicaid Other | Attending: Podiatry | Admitting: Physical Therapy

## 2021-05-05 ENCOUNTER — Encounter (HOSPITAL_COMMUNITY): Payer: Self-pay | Admitting: Physical Therapy

## 2021-05-05 ENCOUNTER — Other Ambulatory Visit: Payer: Self-pay

## 2021-05-05 DIAGNOSIS — R262 Difficulty in walking, not elsewhere classified: Secondary | ICD-10-CM | POA: Diagnosis present

## 2021-05-05 DIAGNOSIS — M25675 Stiffness of left foot, not elsewhere classified: Secondary | ICD-10-CM | POA: Insufficient documentation

## 2021-05-05 DIAGNOSIS — M25572 Pain in left ankle and joints of left foot: Secondary | ICD-10-CM | POA: Insufficient documentation

## 2021-05-05 NOTE — Therapy (Signed)
[facility] [address] [place], [place], [address] Phone: [phone]   Fax:  [phone]  Physical Therapy Treatment  Patient Details  Name: Kevin Leonard MRN: [id number] Date of Birth: 12-22-2000 Referring Provider (PT): Ventura Sellers   Encounter Date: 05/05/2021   PT End of Session - 05/05/21 1000     Visit Number 5    Number of Visits 12    Date for PT Re-Evaluation 05/20/21    Authorization Type healthy blue    Authorization Time Period 12 visits 10/12-11/23/22    Authorization - Visit Number 5    Authorization - Number of Visits 12    Progress Note Due on Visit 10    PT Start Time 1000    PT Stop Time 1038    PT Time Calculation (min) 38 min    Activity Tolerance Patient tolerated treatment well    Behavior During Therapy [facility] for tasks assessed/performed             Past Medical History:  Diagnosis Date   Pes planus, congenital    bilaterally    Past Surgical History:  Procedure Laterality Date   CALCANEAL OSTEOTOMY Left 12/20/2017   Procedure: [name] CALCANEAL OSTEOTOMY;  Surgeon: Park Liter, DPM;  Location: [facility];  Service: Podiatry;  Laterality: Left;   CALCANEAL OSTEOTOMY Right 02/28/2018   Procedure: [name] CALCANEAL OSTEOTOMY;  Surgeon: Park Liter, DPM;  Location: [facility];  Service: Podiatry;  Laterality: Right;   CALCANEAL OSTEOTOMY Left 07/25/2018   Procedure: COTTON TARSAL OSTEOTOMY AND [name] CALCANIAL OSTEOTOMY;  Surgeon: Park Liter, DPM;  Location: [facility];  Service: Podiatry;  Laterality: Left;   CALCANEAL OSTEOTOMY Left 04/10/2019   Procedure: CALCANEAL OSTECTOMY LEFT FOOT;  Surgeon: Park Liter, DPM;  Location: [facility];  Service: Podiatry;  Laterality: Left;   FLAT FOOT CORRECTION Left 01/27/2021   Procedure: FLAT FOOT CORRECTION;  Surgeon: Park Liter, DPM;  Location: [facility] ;   Service: Podiatry;  Laterality: Left;   FLAT FOOT RECONSTRUCTION-TAL GASTROC RECESSION Right 02/28/2018   Procedure: GASTROCNEMIUS RECESSION;  Surgeon: Park Liter, DPM;  Location: [facility];  Service: Podiatry;  Laterality: Right;   GASTROC RECESSION EXTREMITY Left 12/20/2017   Procedure: GASTROC RECESSION EXTREMITY;  Surgeon: Park Liter, DPM;  Location: [facility];  Service: Podiatry;  Laterality: Left;   GRAFT APPLICATION Right 02/28/2018   Procedure: HEMI-CYLINDRICAL INTERCALARY ALLOGRAFT;  Surgeon: Park Liter, DPM;  Location: [facility];  Service: Podiatry;  Laterality: Right;   HARDWARE REMOVAL Left 07/25/2018   Procedure: HARDWARE REMOVAL;  Surgeon: Park Liter, DPM;  Location: [facility];  Service: Podiatry;  Laterality: Left;   INCISION AND DRAINAGE ABSCESS     to buttock at age of 2   METATARSAL OSTEOTOMY Left 12/20/2017   Procedure: METATARSAL OSTEOTOMY;  Surgeon: Park Liter, DPM;  Location: [facility];  Service: Podiatry;  Laterality: Left;   METATARSAL OSTEOTOMY Right 02/28/2018   Procedure: COTTON TARSAL OSTEOTOMY;  Surgeon: Park Liter, DPM;  Location: [facility];  Service: Podiatry;  Laterality: Right;   OSTECTOMY Left 12/20/2017   Procedure: OSTECTOMY;  Surgeon: Park Liter, DPM;  Location:  SURGERY CENTER;  Service: Podiatry;  Laterality: Left;   OSTECTOMY Right 02/28/2018   Procedure: CALCANEAL OSTECTOMY;  Surgeon: Park Liter, DPM;  Location:  [facility];  Service: Podiatry;  Laterality: Right;   TENDON LENGTHENING Left 04/10/2019   Procedure: [facility] ADVANCED TENDON LEFT FOOT;  Surgeon: Park Liter, DPM;  Location: [facility] [place];  Service: Podiatry;  Laterality: Left;    There were no vitals filed for this visit.   Subjective Assessment - 05/05/21 1001     Subjective Patient states foot is sore this week.     How long can you sit comfortably? no problem    How long can you stand comfortably? Pt will automatically put most of the wt on his right foot.    How long can you walk comfortably? Pt starts having pain after walking for 30 minutes with a crutch and a cam boot.    Patient Stated Goals to improve his mobility and strength.    Currently in Pain? No/denies    Pain Onset More than a month ago                               [facility] Adult PT Treatment/Exercise - 05/05/21 0001       Ankle Exercises: Stretches   Slant Board Stretch 3 reps;30 seconds      Ankle Exercises: Standing   Rocker Board 1 minute   2 lateral, 2 A/P   Other Standing Ankle Exercises tandem stance 3x 30 second holds bilateral; hip abduction 2x 10 bilateral, SLS 3x 30 second holds; lateral stepping 4x 15 feet bilateral, step up 4 inch step 1x 15                     PT Education - 05/05/21 1001     Education Details HEP    Person(s) Educated Patient    Methods Explanation;Demonstration    Comprehension Verbalized understanding;Returned demonstration              PT Short Term Goals - 04/08/21 1155       PT SHORT TERM GOAL #1   Title PT to be I in HEP in order to improve ROM and strength  of Lt ankle.    Time 3    Period Weeks    Status New    Target Date 04/29/21      PT SHORT TERM GOAL #2   Title Pt to be walking with camboot without crutches.    Time 3    Period Weeks    Status New      PT SHORT TERM GOAL #3   Title Pt able to ambulate for an hour without increased pain.    Time 3    Period Weeks    Status New               PT Long Term Goals - 04/08/21 1157       PT LONG TERM GOAL #1   Title Pt to be I in advance HEP in order to increase strength in his Lt LE to be able to walk without his cam boot.    Time 6    Period Weeks    Status New    Target Date 05/20/21      PT LONG TERM GOAL #2   Title Pt pain to be no greater than a 2/10 after  standing/walking for an hour and a half to allow community activity participation.    Time 6    Period Weeks    Status New  PT LONG TERM GOAL #3   Title PT strength of Lt ankle to be a 4/5 so that pt is able to place wt equally on both his Rt and his Lt foot when standing greater than 15 minutes.    Time 6    Period Weeks    Status New                   Plan - 05/05/21 1001     Clinical Impression Statement Patient continues to ambulate with antalgic gait on LLE. Continued with standing exercises focused on balance and strength today. Requires intermittent HHA for balance deficits with greater need required with LLE. Patient with c/o soreness throughout session which he states has been present all week. Patient will continue to benefit from skilled physical therapy in order to reduce impairment and improve function.    Personal Factors and Comorbidities Comorbidity 3+;Finances;Time since onset of injury/illness/exacerbation    Comorbidities 5th surgery on foot    Examination-Activity Limitations Lift;Locomotion Level;Carry;Squat;Stand    Examination-Participation Restrictions Community Activity;Shop    Stability/Clinical Decision Making Evolving/Moderate complexity    Rehab Potential Good    PT Frequency 2x / week    PT Duration 6 weeks    PT Treatment/Interventions Gait training;Therapeutic activities;Therapeutic exercise;Balance training;Patient/family education;Manual techniques;Passive range of motion    PT Next Visit Plan Continue ankle AROM and strength and progress as toelrated. Standing activity, balance    PT Home Exercise Plan supine ankle ROM, towel crunch 10/18 ankle iso 10/26 ankle 4 way, weight shifts    Consulted and Agree with Plan of Care Patient             Patient will benefit from skilled therapeutic intervention in order to improve the following deficits and impairments:  Decreased activity tolerance, Decreased range of motion, Difficulty walking,  Decreased strength, Pain, Hypomobility, Increased edema  Visit Diagnosis: Pain in left ankle and joints of left foot  Stiffness of left foot, not elsewhere classified  Difficulty in walking, not elsewhere classified     Problem List Patient Active Problem List   Diagnosis Date Noted   Acquired pes planus of left foot    Bony exostosis    Retained orthopedic hardware    Pes planus of right foot    Acquired equinus deformity of right foot     10:36 AM, 05/05/21 Wyman Songster PT, DPT Physical Therapist at [facility]   [facility] [address] [place], [place], [address] Phone: [phone]   Fax:  [phone]  Name: Kevin Leonard MRN: [id number] Date of Birth: 03-Jun-2001

## 2021-05-10 ENCOUNTER — Encounter (HOSPITAL_COMMUNITY): Payer: Medicaid Other | Admitting: Physical Therapy

## 2021-05-13 ENCOUNTER — Ambulatory Visit (HOSPITAL_COMMUNITY): Payer: Medicaid Other | Admitting: Physical Therapy

## 2021-05-13 ENCOUNTER — Other Ambulatory Visit: Payer: Self-pay

## 2021-05-13 ENCOUNTER — Encounter (HOSPITAL_COMMUNITY): Payer: Self-pay | Admitting: Physical Therapy

## 2021-05-13 DIAGNOSIS — M25572 Pain in left ankle and joints of left foot: Secondary | ICD-10-CM | POA: Diagnosis not present

## 2021-05-13 DIAGNOSIS — M25675 Stiffness of left foot, not elsewhere classified: Secondary | ICD-10-CM

## 2021-05-13 DIAGNOSIS — R262 Difficulty in walking, not elsewhere classified: Secondary | ICD-10-CM

## 2021-05-13 NOTE — Therapy (Signed)
[facility] [address] [place], [place], [address] Phone: [phone]   Fax:  [phone]  Physical Therapy Treatment  Patient Details  Name: Kevin Leonard MRN: [id number] Date of Birth: 01-23-2001 Referring Provider (PT): Ventura Sellers   Encounter Date: 05/13/2021   PT End of Session - 05/13/21 1056     Visit Number 6    Number of Visits 12    Date for PT Re-Evaluation 05/20/21    Authorization Type healthy blue    Authorization Time Period 12 visits 10/12-11/23/22    Authorization - Visit Number 6    Authorization - Number of Visits 12    Progress Note Due on Visit 10    PT Start Time 1057   arrives late   PT Stop Time 1125    PT Time Calculation (min) 28 min    Activity Tolerance Patient tolerated treatment well    Behavior During Therapy [facility] for tasks assessed/performed             Past Medical History:  Diagnosis Date   Pes planus, congenital    bilaterally    Past Surgical History:  Procedure Laterality Date   CALCANEAL OSTEOTOMY Left 12/20/2017   Procedure: [name] CALCANEAL OSTEOTOMY;  Surgeon: Park Liter, DPM;  Location: [facility];  Service: Podiatry;  Laterality: Left;   CALCANEAL OSTEOTOMY Right 02/28/2018   Procedure: [name] CALCANEAL OSTEOTOMY;  Surgeon: Park Liter, DPM;  Location: [facility];  Service: Podiatry;  Laterality: Right;   CALCANEAL OSTEOTOMY Left 07/25/2018   Procedure: COTTON TARSAL OSTEOTOMY AND [name] CALCANIAL OSTEOTOMY;  Surgeon: Park Liter, DPM;  Location: [facility];  Service: Podiatry;  Laterality: Left;   CALCANEAL OSTEOTOMY Left 04/10/2019   Procedure: CALCANEAL OSTECTOMY LEFT FOOT;  Surgeon: Park Liter, DPM;  Location: [facility];  Service: Podiatry;  Laterality: Left;   FLAT FOOT CORRECTION Left 01/27/2021   Procedure: FLAT FOOT CORRECTION;  Surgeon: Park Liter, DPM;  [facility];  Service: Podiatry;  Laterality: Left;   FLAT FOOT RECONSTRUCTION-TAL GASTROC RECESSION Right 02/28/2018   Procedure: GASTROCNEMIUS RECESSION;  Surgeon: Park Liter, DPM;  Location: [facility];  Service: Podiatry;  Laterality: Right;   GASTROC RECESSION EXTREMITY Left 12/20/2017   Procedure: GASTROC RECESSION EXTREMITY;  Surgeon: Park Liter, DPM;  Location: [facility];  Service: Podiatry;  Laterality: Left;   GRAFT APPLICATION Right 02/28/2018   Procedure: HEMI-CYLINDRICAL INTERCALARY ALLOGRAFT;  Surgeon: Park Liter, DPM;  Location: [facility];  Service: Podiatry;  Laterality: Right;   HARDWARE REMOVAL Left 07/25/2018   Procedure: HARDWARE REMOVAL;  Surgeon: Park Liter, DPM;  Location: [facility];  Service: Podiatry;  Laterality: Left;   INCISION AND DRAINAGE ABSCESS     to buttock at age of 2   METATARSAL OSTEOTOMY Left 12/20/2017   Procedure: METATARSAL OSTEOTOMY;  Surgeon: Park Liter, DPM;  Location: [facility];  Service: Podiatry;  Laterality: Left;   METATARSAL OSTEOTOMY Right 02/28/2018   Procedure: COTTON TARSAL OSTEOTOMY;  Surgeon: Park Liter, DPM;  Location: [facility];  Service: Podiatry;  Laterality: Right;   OSTECTOMY Left 12/20/2017   Procedure: OSTECTOMY;  Surgeon: Park Liter, DPM;  Location: [facility];  Service: Podiatry;  Laterality: Left;   OSTECTOMY Right 02/28/2018   Procedure: CALCANEAL OSTECTOMY;  Surgeon: Park Liter,  DPM;  Location: [facility];  Service: Podiatry;  Laterality: Right;   TENDON LENGTHENING Left 04/10/2019   Procedure: [facility] ADVANCED TENDON LEFT FOOT;  Surgeon: Park Liter, DPM;  Location: [facility];  Service: Podiatry;  Laterality: Left;    There were no vitals filed for this visit.   Subjective Assessment - 05/13/21 1057     Subjective Patient states foot is  doing pretty good.    How long can you sit comfortably? no problem    How long can you stand comfortably? Pt will automatically put most of the wt on his right foot.    How long can you walk comfortably? Pt starts having pain after walking for 30 minutes with a crutch and a cam boot.    Patient Stated Goals to improve his mobility and strength.    Currently in Pain? No/denies    Pain Onset More than a month ago                               [facility] Adult PT Treatment/Exercise - 05/13/21 0001       Ankle Exercises: Stretches   Slant Board Stretch 3 reps;30 seconds      Ankle Exercises: Standing   Rocker Board 1 minute   2 lateral, 2 A/P   Other Standing Ankle Exercises tandem stance on foam 3x 30 second holds bilateral; SLS 3x 30 second holds bilateral; lateral stepping 6x 15 feet bilateral, stairs 7 inch 3 RT                     PT Education - 05/13/21 1057     Education Details HEP    Person(s) Educated Patient    Methods Explanation;Demonstration    Comprehension Returned demonstration;Verbalized understanding              PT Short Term Goals - 04/08/21 1155       PT SHORT TERM GOAL #1   Title PT to be I in HEP in order to improve ROM and strength  of Lt ankle.    Time 3    Period Weeks    Status New    Target Date 04/29/21      PT SHORT TERM GOAL #2   Title Pt to be walking with camboot without crutches.    Time 3    Period Weeks    Status New      PT SHORT TERM GOAL #3   Title Pt able to ambulate for an hour without increased pain.    Time 3    Period Weeks    Status New               PT Long Term Goals - 04/08/21 1157       PT LONG TERM GOAL #1   Title Pt to be I in advance HEP in order to increase strength in his Lt LE to be able to walk without his cam boot.    Time 6    Period Weeks    Status New    Target Date 05/20/21      PT LONG TERM GOAL #2   Title Pt pain to be no greater than a 2/10 after  standing/walking for an hour and a half to allow community activity participation.    Time 6    Period Weeks    Status New  PT LONG TERM GOAL #3   Title PT strength of Lt ankle to be a 4/5 so that pt is able to place wt equally on both his Rt and his Lt foot when standing greater than 15 minutes.    Time 6    Period Weeks    Status New                   Plan - 05/13/21 1057     Clinical Impression Statement Session limited by patient's late arrival. Continued with ankle mobility and strengthening exercises which are tolerated well. He requires intermittent UE support for unsteadiness with balance exercises.  Patient ambulating with improving gait pattern without boot or crutches with continued minimally antalgic gait. Anticipate d/c next session. Patient will continue to benefit from skilled physical therapy in order to reduce impairment and improve function.    Personal Factors and Comorbidities Comorbidity 3+;Finances;Time since onset of injury/illness/exacerbation    Comorbidities 5th surgery on foot    Examination-Activity Limitations Lift;Locomotion Level;Carry;Squat;Stand    Examination-Participation Restrictions Community Activity;Shop    Stability/Clinical Decision Making Evolving/Moderate complexity    Rehab Potential Good    PT Frequency 2x / week    PT Duration 6 weeks    PT Treatment/Interventions Gait training;Therapeutic activities;Therapeutic exercise;Balance training;Patient/family education;Manual techniques;Passive range of motion    PT Next Visit Plan Continue ankle AROM and strength and progress as toelrated. Standing activity, balance    PT Home Exercise Plan supine ankle ROM, towel crunch 10/18 ankle iso 10/26 ankle 4 way, weight shifts    Consulted and Agree with Plan of Care Patient             Patient will benefit from skilled therapeutic intervention in order to improve the following deficits and impairments:  Decreased activity tolerance,  Decreased range of motion, Difficulty walking, Decreased strength, Pain, Hypomobility, Increased edema  Visit Diagnosis: Pain in left ankle and joints of left foot  Stiffness of left foot, not elsewhere classified  Difficulty in walking, not elsewhere classified     Problem List Patient Active Problem List   Diagnosis Date Noted   Acquired pes planus of left foot    Bony exostosis    Retained orthopedic hardware    Pes planus of right foot    Acquired equinus deformity of right foot     11:21 AM, 05/13/21 Wyman Songster PT, DPT Physical Therapist at [facility]   [place] [address] [place], [place], [address] Phone: [phone]   Fax:  [phone]  Name: ARIEN BENINCASA MRN: [id number] Date of Birth: 2001-03-06

## 2021-05-17 ENCOUNTER — Encounter (HOSPITAL_COMMUNITY): Payer: Medicaid Other | Admitting: Physical Therapy

## 2021-05-19 ENCOUNTER — Telehealth (HOSPITAL_COMMUNITY): Payer: Self-pay | Admitting: Physical Therapy

## 2021-05-19 NOTE — Telephone Encounter (Signed)
Pt's mom called to cx due to his foot has swallon up and he will see the MD on Friday. They do not want to r/s until he is seen by the MD.

## 2021-05-20 ENCOUNTER — Ambulatory Visit (HOSPITAL_COMMUNITY): Payer: Medicaid Other | Admitting: Physical Therapy

## 2021-05-21 ENCOUNTER — Ambulatory Visit: Payer: Medicaid Other

## 2021-05-21 ENCOUNTER — Ambulatory Visit (INDEPENDENT_AMBULATORY_CARE_PROVIDER_SITE_OTHER): Payer: Medicaid Other | Admitting: Podiatry

## 2021-05-21 ENCOUNTER — Other Ambulatory Visit: Payer: Self-pay

## 2021-05-21 DIAGNOSIS — Q6652 Congenital pes planus, left foot: Secondary | ICD-10-CM

## 2021-05-21 DIAGNOSIS — Z9889 Other specified postprocedural states: Secondary | ICD-10-CM

## 2021-05-21 DIAGNOSIS — Q6651 Congenital pes planus, right foot: Secondary | ICD-10-CM

## 2021-05-21 DIAGNOSIS — Q666 Other congenital valgus deformities of feet: Secondary | ICD-10-CM | POA: Diagnosis not present

## 2021-05-21 NOTE — Progress Notes (Signed)
SITUATION Reason for Consult: Evaluation for Bilateral Custom Foot Orthoses Patient / Caregiver Report: Patient has had triple arthrodesis and needs new FOs  OBJECTIVE DATA: Patient History / Diagnosis: Congenital Pes Planovalgus Bilateral Current or Previous Devices: Energy manager Examination: Skin presentation:   Intact Ulcers & Callousing:   None and no history Toe / Foot Deformities:  Pes Planovalgus Weight Bearing Presentation:  Pes Planovalgus Sensation:    Intact  ORTHOTIC RECOMMENDATION Recommended Device: 1x pair of custom [employer] UCBL type FOs referencing previous order [id number]  GOALS OF ORTHOSES - Reduce Pain - Prevent Foot Deformity - Prevent Progression of Further Foot Deformity - Relieve Pressure - Improve the Overall Biomechanical Function of the Foot and Lower Extremity.  ACTIONS PERFORMED Patient was casted for Foot Orthoses via crush box. Procedure was explained and patient tolerated procedure well. All questions were answered and concerns addressed.  PLAN Insurance to be verified and out of pocket cost communicated to patient. Once cost verified and agreed upon, casts are to be sent to [facility] for fabrication. Patient is to be called for fitting when devices are ready.

## 2021-05-24 NOTE — Progress Notes (Signed)
  Subjective:  Patient ID: Kevin Leonard, male    DOB: 08/11/2000,  MRN: [id number]  Chief Complaint  Patient presents with   Routine Post Op    Post op, left foot and ankle. Pt states he is doing well.    DOS: 01/27/21 Procedure: Flatfoot correction left foot with subtalar/talonavicular arthrodesis, removal of hardware   20 y.o. male presents with the above complaint. History confirmed with patient. Back in normal shoegear denies pain or issues.  Objective:  Physical Exam: no tenderness at the surgical site, local edema noted, and calf supple, nontender. Hindfoot rectus, clinically fused, mild forefoot abduction deformity remains Incision: well healed  Assessment:   1. Congenital pes planus of left foot   2. Congenital hindfoot valgus     Plan:  Patient was evaluated and treated and all questions answered.  Post-operative State -Doing very well, denies issues. In normal shoegear without complaints -Will fabricate new CMOs, courtesy -F/u ~6 weeks after CMO pickup to ensure no issues  No follow-ups on file.

## 2021-07-15 ENCOUNTER — Telehealth: Payer: Self-pay | Admitting: Podiatry

## 2021-07-15 NOTE — Telephone Encounter (Signed)
Pts grandmother called yesterday afternoon asking about status of orthotics that were ordered a while back.  Upon checking it looks like they were to be ordered but did not go thru.  I have contacted pts grandmother and apologized but I have faxed a ordered over and asked for them to be rushed. Pt has appt 1.20 and I am expecting them to be in at that time.

## 2021-07-23 ENCOUNTER — Telehealth: Payer: Self-pay | Admitting: Podiatry

## 2021-07-23 ENCOUNTER — Other Ambulatory Visit: Payer: Self-pay

## 2021-07-23 ENCOUNTER — Ambulatory Visit (INDEPENDENT_AMBULATORY_CARE_PROVIDER_SITE_OTHER): Payer: Medicaid Other

## 2021-07-23 ENCOUNTER — Ambulatory Visit (INDEPENDENT_AMBULATORY_CARE_PROVIDER_SITE_OTHER): Payer: Medicaid Other | Admitting: Podiatry

## 2021-07-23 DIAGNOSIS — R52 Pain, unspecified: Secondary | ICD-10-CM

## 2021-07-23 DIAGNOSIS — Q6651 Congenital pes planus, right foot: Secondary | ICD-10-CM

## 2021-07-23 DIAGNOSIS — Q666 Other congenital valgus deformities of feet: Secondary | ICD-10-CM

## 2021-07-23 DIAGNOSIS — Q6652 Congenital pes planus, left foot: Secondary | ICD-10-CM | POA: Diagnosis not present

## 2021-07-23 NOTE — Telephone Encounter (Signed)
Spoke to pts grandpa to let him know the orthotics are not in and we canceled the appt with Arlys John. But to keep the appt with Dr Samuella Cota. He stated he would let him know.

## 2021-07-27 ENCOUNTER — Other Ambulatory Visit: Payer: Self-pay | Admitting: Podiatry

## 2021-07-27 DIAGNOSIS — Q6652 Congenital pes planus, left foot: Secondary | ICD-10-CM

## 2021-07-30 NOTE — Progress Notes (Signed)
°  Subjective:  Patient ID: Kevin Leonard, male    DOB: Jan 03, 2001,  MRN: [id number]  Chief Complaint  Patient presents with   Foot Pain    Recurring pain- pt reports its worse than before- mentioned he has not got his orthotics yet- further evaluation    DOS: 01/27/21 Procedure: Flatfoot correction left foot with subtalar/talonavicular arthrodesis, removal of hardware   21 y.o. male presents with the above complaint. History confirmed with patient. Feels like "a battery is being drilled into [his] foot". Has not gotten his CMOs.  Objective:  Physical Exam: no tenderness at the surgical site, local edema noted, and calf supple, nontender. Hindfoot rectus, clinically fused, mild forefoot abduction deformity remains Incision: well healed  Assessment:   1. Pain   2. Congenital pes planus of left foot   3. Congenital pes planus of right foot   4. Congenital hindfoot valgus     Plan:  Patient was evaluated and treated and all questions answered.  Post-operative State -Unfortunately he was unable to get his inserts yet. I think his pain will be improved when he gets these. XR taken today show full healing and good consolidation of the surgical site. Possible he feels the inferior screw and this may benefit from removal but will hold off until we see how he does with his inserts.  Return in about 6 weeks (around 09/03/2021).

## 2021-08-12 ENCOUNTER — Ambulatory Visit: Payer: Medicaid Other

## 2021-08-12 ENCOUNTER — Other Ambulatory Visit: Payer: Self-pay

## 2021-08-12 DIAGNOSIS — Q6651 Congenital pes planus, right foot: Secondary | ICD-10-CM

## 2021-08-12 DIAGNOSIS — Q6652 Congenital pes planus, left foot: Secondary | ICD-10-CM

## 2021-08-12 DIAGNOSIS — Q666 Other congenital valgus deformities of feet: Secondary | ICD-10-CM

## 2021-08-12 DIAGNOSIS — Q6689 Other  specified congenital deformities of feet: Secondary | ICD-10-CM

## 2021-08-12 NOTE — Progress Notes (Signed)
SITUATION: Reason for Visit: Fitting and Delivery of Custom Fabricated Foot Orthoses Patient Report: Patient reports comfort and is satisfied with device.  OBJECTIVE DATA: Patient History / Diagnosis:     ICD-10-CM   1. Congenital pes planus of left foot  Q66.52     2. Congenital pes planus of right foot  Q66.51     3. 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 Congenital hindfoot valgus  Q66.6       Provided Device:  Custom Functional Foot Orthotics     [employer]: [id number]  GOAL OF ORTHOSIS - Improve gait - Decrease energy expenditure - Improve Balance - Provide Triplanar stability of foot complex - Facilitate motion  ACTIONS PERFORMED Patient was fit with foot orthotics trimmed to shoe last. Patient tolerated fittign procedure.   Patient was provided with verbal and written instruction and demonstration regarding donning, doffing, wear, care, proper fit, function, purpose, cleaning, and use of the orthosis and in all related precautions and risks and benefits regarding the orthosis.  Patient was also provided with verbal instruction regarding how to report any failures or malfunctions of the orthosis and necessary follow up care. Patient was also instructed to contact our office regarding any change in status that may affect the function of the orthosis.  Patient demonstrated independence with proper donning, doffing, and fit and verbalized understanding of all instructions.  PLAN: Patient is to follow up in one week or as necessary (PRN). All questions were answered and concerns addressed. Plan of care was discussed with and agreed upon by the patient.

## 2021-09-03 ENCOUNTER — Ambulatory Visit (INDEPENDENT_AMBULATORY_CARE_PROVIDER_SITE_OTHER): Payer: Medicaid Other | Admitting: Podiatry

## 2021-09-03 ENCOUNTER — Ambulatory Visit: Payer: Medicaid Other

## 2021-09-03 ENCOUNTER — Other Ambulatory Visit: Payer: Self-pay

## 2021-09-03 ENCOUNTER — Ambulatory Visit: Payer: Medicaid Other | Admitting: Podiatry

## 2021-09-03 DIAGNOSIS — Q6651 Congenital pes planus, right foot: Secondary | ICD-10-CM

## 2021-09-03 DIAGNOSIS — M2141 Flat foot [pes planus] (acquired), right foot: Secondary | ICD-10-CM | POA: Diagnosis not present

## 2021-09-03 DIAGNOSIS — T8484XA Pain due to internal orthopedic prosthetic devices, implants and grafts, initial encounter: Secondary | ICD-10-CM

## 2021-09-03 DIAGNOSIS — Q6652 Congenital pes planus, left foot: Secondary | ICD-10-CM

## 2021-09-03 NOTE — Progress Notes (Signed)
SITUATION ?Reason for Consult: Follow-up with custom orthotics ?Patient / Caregiver Report: Patient is not satisfied with wall height of his new orthotics, wishes to have them match the previous set ? ?OBJECTIVE DATA ?History / Diagnosis:  ?  ICD-10-CM   ?1. Congenital pes planus of left foot  Q66.52   ?  ?2. Congenital pes planus of right foot  Q66.51   ?  ? ? ?Change in Pathology: None ? ?ACTIONS PERFORMED ?Patient's equipment was checked for structural stability and fit. Took back orthotics for refabrication. All questions answered and concerns addressed. ? ?PLAN ?Patient to be contacted when remakes are ready. Plan of care discussed with and agreed upon by patient / caregiver. ? ?

## 2021-09-06 ENCOUNTER — Telehealth: Payer: Self-pay

## 2021-09-06 NOTE — Telephone Encounter (Signed)
Foot Orthotics sent to [facility] for Remake ?

## 2021-09-08 NOTE — Progress Notes (Signed)
?Subjective:  ?Patient ID: Kevin Leonard, male    DOB: [birth year],  MRN: [id number] ? ?Chief Complaint  ?Patient presents with  ? Foot Pain  ?  Bilateral foot pain   ? ? ?21 y.o. male presents with the above complaint.  Patient presents with left foot pain.  Patient has had multiple procedures done to the footFor flatfoot reconstruction including subtalar joint fusion with talonavicular joint arthritis as well as [name] graft.  Patient states that he has some pain he is able to do only 15 to 20 minutes of walking because it causes him a lot of pain.  He is known to Dr. March Rummage.  He wanted to get it evaluated.  He has not seen anyone else prior to seeing me.  He has orthotics and needs to be readjusted as well.  The most latest surgery was 01/27/2021 for left foot subtalar/talonavicular joint arthrodesis.  He also states the posterior hindfoot screw seems to be palpable and he can feel the pain.  He would like to have removed. ? ? ?Review of Systems: Negative except as noted in the HPI. Denies N/V/F/Ch. ? ?Past Medical History:  ?Diagnosis Date  ? Pes planus, congenital   ? bilaterally  ? ? ?Current Outpatient Medications:  ?  cephALEXin (KEFLEX) 500 MG capsule, Take 1 capsule (500 mg total) by mouth 2 (two) times daily., Disp: 14 capsule, Rfl: 0 ?  oxyCODONE-acetaminophen (PERCOCET) 5-325 MG tablet, Take 1 tablet by mouth every 4 (four) hours as needed for severe pain., Disp: 20 tablet, Rfl: 0 ? ?Social History  ? ?Tobacco Use  ?Smoking Status Never  ?Smokeless Tobacco Never  ? ? ?No Known Allergies ?Objective:  ?There were no vitals filed for this visit. ?There is no height or weight on file to calculate BMI. ?Constitutional Well developed. ?Well nourished.  ?Vascular Dorsalis pedis pulses palpable bilaterally. ?Posterior tibial pulses palpable bilaterally. ?Capillary refill normal to all digits.  ?No cyanosis or clubbing noted. ?Pedal hair growth normal.  ?Neurologic Normal speech. ?Oriented to person, place,  and time. ?Epicritic sensation to light touch grossly present bilaterally.  ?Dermatologic Nails well groomed and normal in appearance. ?No open wounds. ?No skin lesions.  ?Orthopedic: Pain on palpation in the posterior plantar inferior screw insertion site.  Pain along the arch of the foot as well.  Pain at both the subtalar joint and talonavicular joint.  Limited range of motion noted at both the subtalar joint and talonavicular joint.  Patient may be experiencing pseudoarthrosis.  ? ?Radiographs: 3 views of skeletally mature adult left foot: Pes planovalgus foot structure noted with subtalar joint fusion and talonavicular joint fusion.  Hardware is intact.  No signs of loosening or backing out noted. ?Assessment:  ? ?1. Painful orthopaedic hardware [facility])   ? ?Plan:  ?Patient was evaluated and treated and all questions answered. ? ?Left talonavicular and subtalar joint fusion with a history of arthritis/painful orthopedic hardware ?-All questions and concerns were discussed with the patient in extensive detail. ?-Clinically he is unable to stand on his foot for long period of time and no more than 15 to 20 minutes of walking which causes him pain.  He may also be experiencing pain from pseudoarthrosis as well.  Clinically the talonavicular joint does not appear to be fused. ?-I also discussed about readjustment with orthotics.  Aaron Edelman will work on readjusting his orthotics to something that he had done previously with that seems to help. ?-I will order the CT scan to assess for the  fusion and healing if there is no improvement we will discuss removal/revisional surgery. ? ?No follow-ups on file.  ?

## 2021-09-29 ENCOUNTER — Other Ambulatory Visit: Payer: Self-pay

## 2021-09-29 ENCOUNTER — Ambulatory Visit
Admission: RE | Admit: 2021-09-29 | Discharge: 2021-09-29 | Disposition: A | Discharge status: Home / Self Care | Payer: Medicaid Other | Source: Ambulatory Visit | Attending: Podiatry | Admitting: Podiatry

## 2021-09-29 DIAGNOSIS — T8484XA Pain due to internal orthopedic prosthetic devices, implants and grafts, initial encounter: Secondary | ICD-10-CM

## 2021-10-06 ENCOUNTER — Ambulatory Visit (INDEPENDENT_AMBULATORY_CARE_PROVIDER_SITE_OTHER): Payer: Medicaid Other | Admitting: Podiatry

## 2021-10-06 DIAGNOSIS — M960 Pseudarthrosis after fusion or arthrodesis: Secondary | ICD-10-CM

## 2021-10-14 NOTE — Progress Notes (Signed)
?Subjective:  ?Patient ID: Kevin Leonard, male    DOB: Dec 30, 2000,  MRN: [id number] ? ?Chief Complaint  ?Patient presents with  ? Foot Pain  ? ? ?21 y.o. male presents with the above complaint.  Patient presents with follow-up of left foot pain that is chronic in nature.  Patient had multiple surgeries done for flatfoot reconstruction.  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  He states is doing much better now.  He is able to walk around without too much pain.  He would like to hold off on the surgical options.  This is a very debilitating problem that we will affect him with antalgic gait. ? ? ?Review of Systems: Negative except as noted in the HPI. Denies N/V/F/Ch. ? ?Past Medical History:  ?Diagnosis Date  ? Pes planus, congenital   ? bilaterally  ? ? ?Current Outpatient Medications:  ?  cephALEXin (KEFLEX) 500 MG capsule, Take 1 capsule (500 mg total) by mouth 2 (two) times daily., Disp: 14 capsule, Rfl: 0 ?  oxyCODONE-acetaminophen (PERCOCET) 5-325 MG tablet, Take 1 tablet by mouth every 4 (four) hours as needed for severe pain., Disp: 20 tablet, Rfl: 0 ? ?Social History  ? ?Tobacco Use  ?Smoking Status Never  ?Smokeless Tobacco Never  ? ? ?No Known Allergies ?Objective:  ?There were no vitals filed for this visit. ?There is no height or weight on file to calculate BMI. ?Constitutional Well developed. ?Well nourished.  ?Vascular Dorsalis pedis pulses palpable bilaterally. ?Posterior tibial pulses palpable bilaterally. ?Capillary refill normal to all digits.  ?No cyanosis or clubbing noted. ?Pedal hair growth normal.  ?Neurologic Normal speech. ?Oriented to person, place, and time. ?Epicritic sensation to light touch grossly present bilaterally.  ?Dermatologic Nails well groomed and normal in appearance. ?No open wounds. ?No skin lesions.  ?Orthopedic: Pain on palpation in the posterior plantar inferior screw insertion site.  Pain along the arch of the foot as well.  Pain at both the subtalar  joint and talonavicular joint.  Limited range of motion noted at both the subtalar joint and talonavicular joint.  Patient may be experiencing pseudoarthrosis.  ? ?Radiographs: 3 views of skeletally mature adult left foot: Pes planovalgus foot structure noted with subtalar joint fusion and talonavicular joint fusion.  Hardware is intact.  No signs of loosening or backing out noted. ? ?1. Arthrodesis of the subtalar joint and talonavicular joint. Solid ?osseous fusion across the subtalar joints. Arthrodesis of the ?talonavicular joint without significant osseous fusion across the ?talonavicular joint transfixed with 2 cannulated screws. ?2. Healed osteotomy of the anterior calcaneus. ?3. Pes planus. ?Assessment:  ? 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 ? ?Plan:  ?Patient was evaluated and treated and all questions answered. ? 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 ?-CT scan was reviewed which shows pseudoarthrosis/nonunion of the talonavicular joint which will need revisional surgery.  I discussed this with the patient.  He states he would like to hold off on the surgery for now as his pain seems to have improved.  I did discuss with him that he will definitely need surgery as the fusion is not complete.  I will see him back again in few months to rediscuss the surgical options as well.  He states understanding ?-I discussed with the patient that this is very debilitating and will cause antalgic gait for the rest of his life if there is no  intervention done.  Patient states understanding. ? ?No follow-ups on file.  ?

## 2021-11-03 ENCOUNTER — Encounter (HOSPITAL_COMMUNITY): Payer: Self-pay | Admitting: Emergency Medicine

## 2021-11-03 ENCOUNTER — Emergency Department (HOSPITAL_COMMUNITY): Payer: Medicaid Other

## 2021-11-03 ENCOUNTER — Emergency Department (HOSPITAL_COMMUNITY)
Admission: EM | Admit: 2021-11-03 | Discharge: 2021-11-03 | Disposition: A | Discharge status: Home / Self Care | Payer: Medicaid Other | Attending: Emergency Medicine | Admitting: Emergency Medicine

## 2021-11-03 ENCOUNTER — Other Ambulatory Visit: Payer: Self-pay

## 2021-11-03 DIAGNOSIS — R109 Unspecified abdominal pain: Secondary | ICD-10-CM | POA: Diagnosis present

## 2021-11-03 DIAGNOSIS — N132 Hydronephrosis with renal and ureteral calculous obstruction: Secondary | ICD-10-CM | POA: Insufficient documentation

## 2021-11-03 DIAGNOSIS — N201 Calculus of ureter: Secondary | ICD-10-CM

## 2021-11-03 LAB — COMPREHENSIVE METABOLIC PANEL
ALT: 22 U/L (ref 0–44)
AST: 23 U/L (ref 15–41)
Albumin: 4.9 g/dL (ref 3.5–5.0)
Alkaline Phosphatase: 93 U/L (ref 38–126)
Anion gap: 8 (ref 5–15)
BUN: 11 mg/dL (ref 6–20)
CO2: 27 mmol/L (ref 22–32)
Calcium: 10 mg/dL (ref 8.9–10.3)
Chloride: 106 mmol/L (ref 98–111)
Creatinine, Ser: 0.88 mg/dL (ref 0.61–1.24)
GFR, Estimated: >60 mL/min (ref >60)
Glucose, Bld: 141 mg/dL — ABNORMAL HIGH (ref 70–99)
Potassium: 4 mmol/L (ref 3.5–5.1)
Sodium: 141 mmol/L (ref 135–145)
Total Bilirubin: 0.7 mg/dL (ref 0.3–1.2)
Total Protein: 8 g/dL (ref 6.5–8.1)

## 2021-11-03 LAB — CBC WITH DIFFERENTIAL/PLATELET
Abs Immature Granulocytes: 0.04 10*3/uL (ref 0.00–0.07)
Basophils Absolute: 0 10*3/uL (ref 0.0–0.1)
Basophils Relative: 0 %
Eosinophils Absolute: 0 10*3/uL (ref 0.0–0.5)
Eosinophils Relative: 0 %
HCT: 44 % (ref 39.0–52.0)
Hemoglobin: 14.4 g/dL (ref 13.0–17.0)
Immature Granulocytes: 0 %
Lymphocytes Relative: 11 %
Lymphs Abs: 1 10*3/uL (ref 0.7–4.0)
MCH: 29.5 pg (ref 26.0–34.0)
MCHC: 32.7 g/dL (ref 30.0–36.0)
MCV: 90.2 fL (ref 80.0–100.0)
Monocytes Absolute: 0.3 10*3/uL (ref 0.1–1.0)
Monocytes Relative: 4 %
Neutro Abs: 8 10*3/uL — ABNORMAL HIGH (ref 1.7–7.7)
Neutrophils Relative %: 85 %
Platelets: 246 10*3/uL (ref 150–400)
RBC: 4.88 MIL/uL (ref 4.22–5.81)
RDW: 12.8 % (ref 11.5–15.5)
WBC: 9.4 10*3/uL (ref 4.0–10.5)
nRBC: 0 % (ref 0.0–0.2)

## 2021-11-03 LAB — URINALYSIS, ROUTINE W REFLEX MICROSCOPIC
Bilirubin Urine: NEGATIVE
Glucose, UA: NEGATIVE mg/dL
Ketones, ur: NEGATIVE mg/dL
Leukocytes,Ua: NEGATIVE
Nitrite: NEGATIVE
Protein, ur: 30 mg/dL — AB
RBC / HPF: >50 RBC/hpf — ABNORMAL HIGH (ref 0–5)
Specific Gravity, Urine: 1.017 (ref 1.005–1.030)
pH: 6 (ref 5.0–8.0)

## 2021-11-03 LAB — LIPASE, BLOOD: Lipase: 24 U/L (ref 11–51)

## 2021-11-03 MED ORDER — OXYCODONE HCL 5 MG PO TABS
5.0000 mg | ORAL_TABLET | ORAL | 0 refills | Status: AC | PRN
Start: 2021-11-03 — End: ?

## 2021-11-03 MED ORDER — ONDANSETRON HCL 4 MG/2ML IJ SOLN
4.0000 mg | Freq: Once | INTRAMUSCULAR | Status: AC
  Administered 2021-11-03: 4 mg via INTRAVENOUS
  Filled 2021-11-03: qty 2

## 2021-11-03 MED ORDER — MORPHINE SULFATE (PF) 4 MG/ML IV SOLN
4.0000 mg | Freq: Once | INTRAVENOUS | Status: AC
  Administered 2021-11-03: 4 mg via INTRAVENOUS
  Filled 2021-11-03: qty 1

## 2021-11-03 MED ORDER — KETOROLAC TROMETHAMINE 15 MG/ML IJ SOLN
15.0000 mg | Freq: Once | INTRAMUSCULAR | Status: AC
  Administered 2021-11-03: 15 mg via INTRAVENOUS
  Filled 2021-11-03: qty 1

## 2021-11-03 MED ORDER — SODIUM CHLORIDE 0.9 % IV BOLUS
1000.0000 mL | Freq: Once | INTRAVENOUS | Status: AC
  Administered 2021-11-03: 1000 mL via INTRAVENOUS

## 2021-11-03 NOTE — ED Notes (Signed)
Bladder scan performed  20 ml detected ?

## 2021-11-03 NOTE — ED Provider Notes (Signed)
?[facility] EMERGENCY DEPARTMENT ?Provider Note ? ? ?CSN: [id number] ?Arrival date & time: 11/03/21  1922 ? ?  ? ?History ? ?Chief Complaint  ?Patient presents with  ? Flank Pain  ? ? ?Kevin Leonard is a 21 y.o. male. ? ?HPI ?21 year old male presents with right flank pain.  Started all of a sudden at 4 PM.  Does not radiate.  No shortness of breath but it seems to be worse with breathing.  Has vomited multiple times since it started.  Feels like he cannot have a bowel movement or urinate since it started.  Has never had anything like this before. ? ?Home Medications ?Prior to Admission medications   ?Medication Sig Start Date End Date Taking? Authorizing Provider  ?oxyCODONE (ROXICODONE) 5 MG immediate release tablet Take 1 tablet (5 mg total) by mouth every 4 (four) hours as needed for severe pain. 11/03/21  Yes Pricilla Loveless, MD  ?cephALEXin (KEFLEX) 500 MG capsule Take 1 capsule (500 mg total) by mouth 2 (two) times daily. 01/27/21   Park Liter, DPM  ?   ? ?Allergies    ?Patient has no known allergies.   ? ?Review of Systems   ?Review of Systems  ?Respiratory:  Negative for shortness of breath.   ?Gastrointestinal:  Positive for abdominal pain, nausea and vomiting.  ?Genitourinary:  Positive for flank pain. Negative for hematuria.  ? ?Physical Exam ?Updated Vital Signs ?BP (!) 141/85 (BP Location: Right Arm)   Pulse (!) 112   Temp 98.1 ?F (36.7 ?C) (Oral)   Resp 16   Ht 5\' 11"  (1.803 m)   Wt 80 kg   SpO2 100%   BMI 24.60 kg/m?  ?Physical Exam ?Vitals and nursing note reviewed.  ?Constitutional:   ?   Appearance: He is well-developed.  ?HENT:  ?   Head: Normocephalic and atraumatic.  ?Cardiovascular:  ?   Rate and Rhythm: Normal rate and regular rhythm.  ?   Heart sounds: Normal heart sounds.  ?Pulmonary:  ?   Effort: Pulmonary effort is normal.  ?   Breath sounds: Normal breath sounds.  ?Abdominal:  ?   Palpations: Abdomen is soft.  ?   Tenderness: There is no abdominal tenderness. There is no  right CVA tenderness, left CVA tenderness or guarding.  ?   Comments: Difficult exam due to patient being in a recliner chair in fast track.  However there is no significant tenderness on exam.  ?Skin: ?   General: Skin is warm and dry.  ?Neurological:  ?   Mental Status: He is alert.  ? ? ?ED Results / Procedures / Treatments   ?Labs ?(all labs ordered are listed, but only abnormal results are displayed) ?Labs Reviewed  ?URINALYSIS, ROUTINE W REFLEX MICROSCOPIC - Abnormal; Notable for the following components:  ?    Result Value  ? APPearance HAZY (*)   ? Hgb urine dipstick SMALL (*)   ? Protein, ur 30 (*)   ? RBC / HPF >50 (*)   ? Bacteria, UA RARE (*)   ? All other components within normal limits  ?CBC WITH DIFFERENTIAL/PLATELET - Abnormal; Notable for the following components:  ? Neutro Abs 8.0 (*)   ? All other components within normal limits  ?COMPREHENSIVE METABOLIC PANEL - Abnormal; Notable for the following components:  ? Glucose, Bld 141 (*)   ? All other components within normal limits  ?LIPASE, BLOOD  ? ? ?EKG ?None ? ?Radiology ?CT Renal Stone Study ? ?Result Date:  11/03/2021 ?CLINICAL DATA:  Right flank pain. EXAM: CT ABDOMEN AND PELVIS WITHOUT CONTRAST TECHNIQUE: Multidetector CT imaging of the abdomen and pelvis was performed following the standard protocol without IV contrast. RADIATION DOSE REDUCTION: This exam was performed according to the departmental dose-optimization program which includes automated exposure control, adjustment of the mA and/or kV according to patient size and/or use of iterative reconstruction technique. COMPARISON:  None Available. FINDINGS: Lower chest: No acute abnormality. Hepatobiliary: No focal liver abnormality is seen. No gallstones, gallbladder wall thickening, or biliary dilatation. Pancreas: Unremarkable. No pancreatic ductal dilatation or surrounding inflammatory changes. Spleen: Normal in size without focal abnormality. Adrenals/Urinary Tract: There is a 1 mm  calculus in the distal right ureter just proximal to the ureterovesicular junction. There is mild right-sided hydronephrosis and perinephric fat stranding. No other urinary tract calculi are seen. Adrenal glands, left kidney and bladder are within normal limits. Stomach/Bowel: Stomach is within normal limits. Appendix appears normal. No evidence of bowel wall thickening, distention, or inflammatory changes. Vascular/Lymphatic: No significant vascular findings are present. No enlarged abdominal or pelvic lymph nodes. Reproductive: Prostate is unremarkable. Other: No abdominal wall hernia or abnormality. No abdominopelvic ascites. Musculoskeletal: No acute or significant osseous findings. IMPRESSION: 1. 1 mm calculus in the distal right ureter with mild obstructive uropathy. Electronically Signed   By: Darliss Cheney M.D.   On: 11/03/2021 21:56   ? ?Procedures ?Procedures  ? ? ?Medications Ordered in ED ?Medications  ?sodium chloride 0.9 % bolus 1,000 mL (0 mLs Intravenous Stopped 11/03/21 2201)  ?morphine (PF) 4 MG/ML injection 4 mg (4 mg Intravenous Given 11/03/21 2107)  ?ondansetron [facility]) injection 4 mg (4 mg Intravenous Given 11/03/21 2106)  ?ketorolac (TORADOL) 15 MG/ML injection 15 mg (15 mg Intravenous Given 11/03/21 2235)  ? ? ?ED Course/ Medical Decision Making/ A&P ?  ?                        ?Medical Decision Making ?Amount and/or Complexity of Data Reviewed ?External Data Reviewed: notes. ?Labs: ordered. ?Radiology: ordered and independent interpretation performed. ? ?Risk ?Prescription drug management. ? ? ?Patient found to have right ureteral stone which correlates with his symptoms.  CT images viewed by myself firms mild hydro with ureteral calculus.  Pain is better with IV morphine and Zofran and fluids.  However he still having some pain and feels like he cannot pee.  I think this is probably more pain from the stone as a bladder scan only shows 20 mL.  After Toradol those symptoms have completely resolved  and he feels much better.  He was originally quite tachycardic which I think is primarily pain related and this has improved and lost not quite down to normal, now that his pain is better I think he will continue to improve.  Labs reviewed/interpreted and his white blood cell count is normal, creatinine is normal, and he has some blood but no UTI in the urine.  Will discharge home with outpatient urology follow-up as needed and a short course of oxycodone but he was instructed to primarily use ibuprofen and only the oxycodone for breakthrough pain. ? ? ? ? ? ? ? ?Final Clinical Impression(s) / ED Diagnoses ?Final diagnoses:  ?Right ureteral stone  ? ? ?Rx / DC Orders ?ED Discharge Orders   ? ?      Ordered  ?  oxyCODONE (ROXICODONE) 5 MG immediate release tablet  Every 4 hours PRN       ?  11/03/21 2259  ? ?  ?  ? ?  ? ? ?  ?Pricilla LovelessGoldston, Tajae Rybicki, MD ?11/03/21 2317 ? ?

## 2021-11-03 NOTE — ED Triage Notes (Signed)
Pt c/o right flank pain that started at 1600 with n/v.  ?

## 2021-11-03 NOTE — Discharge Instructions (Addendum)
If you develop worsening, continued, or recurrent abdominal pain, uncontrolled vomiting, fever, chest or back pain, or any other new/concerning symptoms then return to the ER for evaluation.  

## 2021-11-22 ENCOUNTER — Ambulatory Visit: Payer: Medicaid Other

## 2021-11-22 DIAGNOSIS — Q6652 Congenital pes planus, left foot: Secondary | ICD-10-CM

## 2021-11-22 DIAGNOSIS — Q6651 Congenital pes planus, right foot: Secondary | ICD-10-CM

## 2021-11-22 NOTE — Progress Notes (Signed)
SITUATION: Reason for Visit: Fitting and Delivery of Custom Fabricated Foot Orthoses Patient Report: Patient reports comfort and is satisfied with device.  OBJECTIVE DATA: Patient History / Diagnosis:     ICD-10-CM   1. Congenital pes planus of left foot  Q66.52     2. Congenital pes planus of right foot  Q66.51       Provided Device:  Custom Functional Foot Orthotics     [employer]: [id number] - Adjustment  GOAL OF ORTHOSIS - Improve gait - Decrease energy expenditure - Improve Balance - Provide Triplanar stability of foot complex - Facilitate motion  ACTIONS PERFORMED Patient was fit with foot orthotics trimmed to shoe last. Patient tolerated fittign procedure.   Patient was provided with verbal and written instruction and demonstration regarding donning, doffing, wear, care, proper fit, function, purpose, cleaning, and use of the orthosis and in all related precautions and risks and benefits regarding the orthosis.  Patient was also provided with verbal instruction regarding how to report any failures or malfunctions of the orthosis and necessary follow up care. Patient was also instructed to contact our office regarding any change in status that may affect the function of the orthosis.  Patient demonstrated independence with proper donning, doffing, and fit and verbalized understanding of all instructions.  PLAN: Patient is to follow up in one week or as necessary (PRN). All questions were answered and concerns addressed. Plan of care was discussed with and agreed upon by the patient.

## 2022-04-10 ENCOUNTER — Encounter (HOSPITAL_COMMUNITY): Payer: Self-pay

## 2022-04-10 ENCOUNTER — Emergency Department (HOSPITAL_COMMUNITY)
Admission: EM | Admit: 2022-04-10 | Discharge: 2022-04-10 | Disposition: A | Discharge status: Home / Self Care | Payer: Medicaid Other | Attending: Emergency Medicine | Admitting: Emergency Medicine

## 2022-04-10 ENCOUNTER — Other Ambulatory Visit: Payer: Self-pay

## 2022-04-10 DIAGNOSIS — L0201 Cutaneous abscess of face: Secondary | ICD-10-CM | POA: Insufficient documentation

## 2022-04-10 DIAGNOSIS — R519 Headache, unspecified: Secondary | ICD-10-CM | POA: Diagnosis present

## 2022-04-10 MED ORDER — DOXYCYCLINE HYCLATE 100 MG PO CAPS: 100.0000 mg | ORAL_CAPSULE | Freq: Two times a day (BID) | ORAL | 0 refills | Status: AC

## 2022-04-10 MED ORDER — DOXYCYCLINE HYCLATE 100 MG PO TABS
100.0000 mg | ORAL_TABLET | Freq: Once | ORAL | Status: AC
  Administered 2022-04-10: 100 mg via ORAL
  Filled 2022-04-10: qty 1

## 2022-04-10 NOTE — Discharge Instructions (Signed)
As discussed, apply warm wet compresses on and off to your face.  Avoid squeezing or sticking pins in the affected area.  Take the antibiotic as directed.  Return to the emergency department for any new or worsening symptoms such as increasing swelling redness or red streaking of your face.

## 2022-04-10 NOTE — ED Triage Notes (Signed)
Swelling on pt lower right face (lip and chin). Started swelling this morning. Take IBU with relief of pain

## 2022-04-13 NOTE — ED Provider Notes (Signed)
[facility] EMERGENCY DEPARTMENT Provider Note   CSN: [id number] Arrival date & time: 04/10/22  1124     History  No chief complaint on file.   Kevin Leonard is a 21 y.o. male.  HPI     Kevin Leonard is a 21 y.o. male who presents to the Emergency Department complaining of pain, redness and swelling to right lower face and chin area.  He noticed a "bump" to this area few days ago that has gradually became more painful.  Noticed swelling prior to arrival.  States he has had some drainage from this area as well.  Feels like his chin and lower lip are swollen.  He denies neck pain, difficulty swallowing , shortness of breath, swelling of his tongue fever or chills  Home Medications Prior to Admission medications   Medication Sig Start Date End Date Taking? Authorizing Provider  doxycycline (VIBRAMYCIN) 100 MG capsule Take 1 capsule (100 mg total) by mouth 2 (two) times daily. 04/10/22  Yes Verdon Ferrante, PA-C  oxyCODONE (ROXICODONE) 5 MG immediate release tablet Take 1 tablet (5 mg total) by mouth every 4 (four) hours as needed for severe pain. 11/03/21   Sherwood Gambler, MD      Allergies    Patient has no known allergies.    Review of Systems   Review of Systems  Constitutional:  Negative for chills and fever.  HENT:  Positive for facial swelling. Negative for congestion, ear pain, sore throat and trouble swallowing.   Respiratory:  Negative for shortness of breath.   Neurological:  Negative for dizziness, numbness and headaches.    Physical Exam Updated Vital Signs BP (!) 143/79 (BP Location: Right Arm)   Pulse 90   Temp 99 F (37.2 C) (Oral)   Resp 18   Ht 5\' 11"  (1.803 m)   Wt 74.8 kg   SpO2 96%   BMI 23.01 kg/m  Physical Exam Vitals and nursing note reviewed.  Constitutional:      General: He is not in acute distress.    Appearance: He is not ill-appearing or toxic-appearing.  HENT:     Head: No right periorbital erythema.     Jaw: There is  normal jaw occlusion.      Comments: 2 cm Focal area of mild erythema and induration to right lower face/chin.  Slightly raised, crusted papule present.  No active drainage or fluctuance.      Right Ear: Tympanic membrane and ear canal normal.     Left Ear: Tympanic membrane and ear canal normal.     Nose: Nose normal.     Mouth/Throat:     Mouth: Mucous membranes are moist. No oral lesions or angioedema.     Dentition: No dental tenderness, gingival swelling or dental caries.     Pharynx: Oropharynx is clear. Uvula midline. No pharyngeal swelling or uvula swelling.     Comments: No trismus or sublingual abnormalities Cardiovascular:     Rate and Rhythm: Normal rate and regular rhythm.     Pulses: Normal pulses.  Pulmonary:     Effort: Pulmonary effort is normal.  Musculoskeletal:     Cervical back: Normal range of motion. No tenderness.  Lymphadenopathy:     Cervical: No cervical adenopathy.  Neurological:     General: No focal deficit present.     Mental Status: He is alert.     Sensory: No sensory deficit.     Motor: No weakness.     ED Results /  Procedures / Treatments   Labs (all labs ordered are listed, but only abnormal results are displayed) Labs Reviewed - No data to display  EKG None  Radiology No results found.  Procedures Procedures    Medications Ordered in ED Medications  doxycycline (VIBRA-TABS) tablet 100 mg (100 mg Oral Given 04/10/22 1232)    ED Course/ Medical Decision Making/ A&P                           Medical Decision Making Pt here with likely developing abscess of the face.  Area has been drainage per pt.  No clinical evidence for angioedema, no obvious dental abscess or ludwigs angina.  No trimus.  No indication for I&D at this time.    Amount and/or Complexity of Data Reviewed Discussion of management or test interpretation with external provider(s): Pt agreeable to warm compresses, ibuprofen for pain and will start rx for doxy.   Appears appropriate for d/c home.  Return precautions discussed.   Risk Prescription drug management.           Final Clinical Impression(s) / ED Diagnoses Final diagnoses:  Abscess of face    Rx / DC Orders ED Discharge Orders          Ordered    doxycycline (VIBRAMYCIN) 100 MG capsule  2 times daily        04/10/22 1230              Won Kreuzer, [place], PA-C 04/13/22 1655    Franne Forts, DO 04/20/22 1830

## 2022-05-20 ENCOUNTER — Ambulatory Visit (INDEPENDENT_AMBULATORY_CARE_PROVIDER_SITE_OTHER): Payer: Medicaid Other | Admitting: Podiatry

## 2022-05-20 DIAGNOSIS — M960 Pseudarthrosis after fusion or arthrodesis: Secondary | ICD-10-CM

## 2022-05-20 DIAGNOSIS — M216X2 Other acquired deformities of left foot: Secondary | ICD-10-CM

## 2022-05-20 DIAGNOSIS — T8484XA Pain due to internal orthopedic prosthetic devices, implants and grafts, initial encounter: Secondary | ICD-10-CM

## 2022-05-20 NOTE — Progress Notes (Unsigned)
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 male presents with the above complaint.  Patient presents with follow-up of left foot pain that is chronic in nature.  Patient had multiple surgeries done for flatfoot reconstruction.  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  He states is doing much better now.  He is able to walk around without too much pain.  At this time is causing him some rehabilitating antalgic gait.  He is failing all conservative care and would like to proceed with revising the surgery.   Review of Systems: Negative except as noted in the HPI. Denies N/V/F/Ch.  Past Medical History:  Diagnosis Date   Pes planus, congenital    bilaterally    Current Outpatient Medications:    doxycycline (VIBRAMYCIN) 100 MG capsule, Take 1 capsule (100 mg total) by mouth 2 (two) times daily., Disp: 20 capsule, Rfl: 0   oxyCODONE (ROXICODONE) 5 MG immediate release tablet, Take 1 tablet (5 mg total) by mouth every 4 (four) hours as needed for severe pain., Disp: 5 tablet, Rfl: 0  Social History   Tobacco Use  Smoking Status Never  Smokeless Tobacco Never    No Known Allergies Objective:  There were no vitals filed for this visit. There is no height or weight on file to calculate BMI. Constitutional Well developed. Well nourished.  Vascular Dorsalis pedis pulses palpable bilaterally. Posterior tibial pulses palpable bilaterally. Capillary refill normal to all digits.  No cyanosis or clubbing noted. Pedal hair growth normal.  Neurologic Normal speech. Oriented to person, place, and time. Epicritic sensation to light touch grossly present bilaterally.  Dermatologic Nails well groomed and normal in appearance. No open wounds. No skin lesions.  Orthopedic: Pain on palpation in the posterior plantar inferior screw insertion site.  Pain along the  arch of the foot as well.  Pain at both the subtalar joint and talonavicular joint.  Limited range of motion noted at both the subtalar joint and talonavicular joint.  Patient may be experiencing pseudoarthrosis.   Radiographs: 3 views of skeletally mature adult left foot: Pes planovalgus foot structure noted with subtalar joint fusion and talonavicular joint fusion.  Hardware is intact.  No signs of loosening or backing out noted.  1. Arthrodesis of the subtalar joint and talonavicular joint. Solid osseous fusion across the subtalar joints. Arthrodesis of the talonavicular joint without significant osseous fusion across the talonavicular joint transfixed with 2 cannulated screws. 2. Healed osteotomy of the anterior calcaneus. 3. Pes planus. Assessment:   No diagnosis found.   Plan:  Patient was evaluated and treated and all questions answered.  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 -CT scan was reviewed which shows pseudoarthrosis/nonunion of the talonavicular joint which will need revisional surgery.  -Clinically it continues to bother the patient at this time patient is ready to have it surgically taken care of.  Given that patient has failed all conservative care including bracing immobilization injection I believe patient will benefit from revising the talonavicular joint pseudoarthrosis removal of fixation and performing an arthrodesis with use of possible autograft. -I discussed my preoperative intraoperative postoperative plan in extensive detail with the patient at this time patient would like to proceed with surgery. -I discussed with the patient that this is  very debilitating and will cause antalgic gait for the rest of his life if there is no intervention done.  Patient states understanding. -Informed surgical risk consent was reviewed and read aloud  to the patient.  I reviewed the films.  I have discussed my findings with the patient in great detail.  I have discussed all risks including but not limited to infection, stiffness, scarring, limp, disability, deformity, damage to blood vessels and nerves, numbness, poor healing, need for braces, arthritis, chronic pain, amputation, death.  All benefits and realistic expectations discussed in great detail.  I have made no promises as to the outcome.  I have provided realistic expectations.  I have offered the patient a 2nd opinion, which they have declined and assured me they preferred to proceed despite the risks   No follow-ups on file.

## 2022-07-07 ENCOUNTER — Telehealth: Payer: Self-pay | Admitting: *Deleted

## 2022-07-07 ENCOUNTER — Other Ambulatory Visit: Payer: Self-pay | Admitting: Podiatry

## 2022-07-07 DIAGNOSIS — M960 Pseudarthrosis after fusion or arthrodesis: Secondary | ICD-10-CM

## 2022-07-07 NOTE — Telephone Encounter (Signed)
-----   Message from Felipa Furnace, DPM sent at 07/07/2022  8:21 AM EST ----- Regarding: Call this patient Hi Kevin Leonard,   Can you call this patient and let him know that I order another CT scan. Since last one is one year older.   Thanks

## 2022-07-07 NOTE — Telephone Encounter (Signed)
Patient has been updated and will wait for call to schedule @[facility] .

## 2022-08-04 ENCOUNTER — Encounter: Payer: Self-pay | Admitting: Podiatry

## 2022-08-05 ENCOUNTER — Ambulatory Visit
Admission: RE | Admit: 2022-08-05 | Discharge: 2022-08-05 | Disposition: A | Discharge status: Home / Self Care | Payer: Medicaid Other | Source: Ambulatory Visit | Attending: Podiatry | Admitting: Podiatry

## 2022-08-05 DIAGNOSIS — M960 Pseudarthrosis after fusion or arthrodesis: Secondary | ICD-10-CM

## 2022-08-29 ENCOUNTER — Telehealth: Payer: Self-pay | Admitting: Urology

## 2022-08-29 NOTE — Telephone Encounter (Signed)
DOS - 09/26/22  REMOVAL OF HARDWARE LEFT --- [id number] [facility] JOINT FUSION LEFT --- [id number]  [employer]ICA[employer]   PER [employer] FOR CPT CODES [id number] AND [id number] NO PRIOR AUTH IS REQUIRED.  Transaction ID: [id number]

## 2022-09-07 ENCOUNTER — Emergency Department (HOSPITAL_COMMUNITY)
Admission: EM | Admit: 2022-09-07 | Discharge: 2022-09-07 | Disposition: A | Discharge status: Home / Self Care | Payer: Medicaid Other | Attending: Emergency Medicine | Admitting: Emergency Medicine

## 2022-09-07 ENCOUNTER — Encounter (HOSPITAL_COMMUNITY): Payer: Self-pay | Admitting: Emergency Medicine

## 2022-09-07 ENCOUNTER — Other Ambulatory Visit: Payer: Self-pay

## 2022-09-07 ENCOUNTER — Emergency Department (HOSPITAL_COMMUNITY): Payer: Medicaid Other

## 2022-09-07 DIAGNOSIS — N200 Calculus of kidney: Secondary | ICD-10-CM

## 2022-09-07 DIAGNOSIS — N132 Hydronephrosis with renal and ureteral calculous obstruction: Secondary | ICD-10-CM | POA: Insufficient documentation

## 2022-09-07 DIAGNOSIS — R1032 Left lower quadrant pain: Secondary | ICD-10-CM | POA: Diagnosis present

## 2022-09-07 LAB — URINALYSIS, ROUTINE W REFLEX MICROSCOPIC
Bilirubin Urine: NEGATIVE
Glucose, UA: NEGATIVE mg/dL
Ketones, ur: 5 mg/dL — AB
Leukocytes,Ua: NEGATIVE
Nitrite: NEGATIVE
Protein, ur: NEGATIVE mg/dL
RBC / HPF: >50 RBC/hpf (ref 0–5)
Specific Gravity, Urine: 1.014 (ref 1.005–1.030)
pH: 6 (ref 5.0–8.0)

## 2022-09-07 LAB — COMPREHENSIVE METABOLIC PANEL
ALT: 27 U/L (ref 0–44)
AST: 22 U/L (ref 15–41)
Albumin: 4.3 g/dL (ref 3.5–5.0)
Alkaline Phosphatase: 71 U/L (ref 38–126)
Anion gap: 10 (ref 5–15)
BUN: 15 mg/dL (ref 6–20)
CO2: 28 mmol/L (ref 22–32)
Calcium: 9.3 mg/dL (ref 8.9–10.3)
Chloride: 103 mmol/L (ref 98–111)
Creatinine, Ser: 0.85 mg/dL (ref 0.61–1.24)
GFR, Estimated: >60 mL/min (ref >60)
Glucose, Bld: 94 mg/dL (ref 70–99)
Potassium: 3.3 mmol/L — ABNORMAL LOW (ref 3.5–5.1)
Sodium: 141 mmol/L (ref 135–145)
Total Bilirubin: 1 mg/dL (ref 0.3–1.2)
Total Protein: 7.6 g/dL (ref 6.5–8.1)

## 2022-09-07 LAB — CBC WITH DIFFERENTIAL/PLATELET
Abs Immature Granulocytes: 0.02 10*3/uL (ref 0.00–0.07)
Basophils Absolute: 0 10*3/uL (ref 0.0–0.1)
Basophils Relative: 1 %
Eosinophils Absolute: 0.1 10*3/uL (ref 0.0–0.5)
Eosinophils Relative: 1 %
HCT: 43.6 % (ref 39.0–52.0)
Hemoglobin: 14.2 g/dL (ref 13.0–17.0)
Immature Granulocytes: 0 %
Lymphocytes Relative: 22 %
Lymphs Abs: 1.5 10*3/uL (ref 0.7–4.0)
MCH: 29.8 pg (ref 26.0–34.0)
MCHC: 32.6 g/dL (ref 30.0–36.0)
MCV: 91.4 fL (ref 80.0–100.0)
Monocytes Absolute: 0.5 10*3/uL (ref 0.1–1.0)
Monocytes Relative: 8 %
Neutro Abs: 4.5 10*3/uL (ref 1.7–7.7)
Neutrophils Relative %: 68 %
Platelets: 219 10*3/uL (ref 150–400)
RBC: 4.77 MIL/uL (ref 4.22–5.81)
RDW: 12.4 % (ref 11.5–15.5)
WBC: 6.6 10*3/uL (ref 4.0–10.5)
nRBC: 0 % (ref 0.0–0.2)

## 2022-09-07 MED ORDER — FENTANYL CITRATE PF 50 MCG/ML IJ SOSY
25.0000 ug | PREFILLED_SYRINGE | Freq: Once | INTRAMUSCULAR | Status: AC
  Administered 2022-09-07: 25 ug via INTRAVENOUS
  Filled 2022-09-07: qty 1

## 2022-09-07 MED ORDER — IBUPROFEN 400 MG PO TABS: 400.0000 mg | ORAL_TABLET | Freq: Three times a day (TID) | ORAL | 0 refills | Status: AC

## 2022-09-07 MED ORDER — SODIUM CHLORIDE 0.9 % IV BOLUS
1000.0000 mL | Freq: Once | INTRAVENOUS | Status: AC
  Administered 2022-09-07: 1000 mL via INTRAVENOUS

## 2022-09-07 MED ORDER — ONDANSETRON HCL 4 MG/2ML IJ SOLN
4.0000 mg | Freq: Once | INTRAMUSCULAR | Status: AC
  Administered 2022-09-07: 4 mg via INTRAVENOUS
  Filled 2022-09-07: qty 2

## 2022-09-07 MED ORDER — SODIUM CHLORIDE 0.9 % IV SOLN: INTRAVENOUS | Status: DC

## 2022-09-07 MED ORDER — ONDANSETRON 4 MG PO TBDP: 4.0000 mg | ORAL_TABLET | Freq: Three times a day (TID) | ORAL | 0 refills | Status: AC | PRN

## 2022-09-07 MED ORDER — KETOROLAC TROMETHAMINE 30 MG/ML IJ SOLN
15.0000 mg | Freq: Once | INTRAMUSCULAR | Status: AC
  Administered 2022-09-07: 15 mg via INTRAVENOUS
  Filled 2022-09-07: qty 1

## 2022-09-07 NOTE — ED Provider Notes (Signed)
[facility] Provider Note   CSN: [id number] Arrival date & time: 09/07/22  [id number]     History  Chief Complaint  Patient presents with   Abdominal Pain    Kevin Leonard is a 22 y.o. male.  HPI Patient with history of prior kidney stone presents with pain left lower abdomen, left flank.  No scrotal pain, penis pain, though there is dysuria.  Onset was about 1 week ago, since that time pain has been persistent.  Severity is waxing, waning. 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    Home Medications Prior to Admission medications   Medication Sig Start Date End Date Taking? Authorizing Provider  ibuprofen (ADVIL) 400 MG tablet Take 1 tablet (400 mg total) by mouth 3 (three) times daily for 3 days. Take one tablet three times daily for three days 09/07/22 09/10/22 Yes Carmin Muskrat, MD  ondansetron (ZOFRAN-ODT) 4 MG disintegrating tablet Take 1 tablet (4 mg total) by mouth every 8 (eight) hours as needed for nausea or vomiting. 09/07/22  Yes Carmin Muskrat, MD  doxycycline (VIBRAMYCIN) 100 MG capsule Take 1 capsule (100 mg total) by mouth 2 (two) times daily. Patient not taking: Reported on 09/07/2022 04/10/22   Triplett, Tammy, PA-C  oxyCODONE (ROXICODONE) 5 MG immediate release tablet Take 1 tablet (5 mg total) by mouth every 4 (four) hours as needed for severe pain. Patient not taking: Reported on 09/07/2022 11/03/21   Sherwood Gambler, MD      Allergies    Patient has no known allergies.    Review of Systems   Review of Systems  Gastrointestinal:  Positive for nausea and vomiting.  All other systems reviewed and are negative.   Physical Exam Updated Vital Signs BP 131/88   Pulse (!) 111   Temp 97.8 F (36.6 C) (Oral)   Resp 19   SpO2 100%  Physical Exam Vitals  and nursing note reviewed.  Constitutional:      General: He is not in acute distress.    Appearance: He is well-developed.  HENT:     Head: Normocephalic and atraumatic.  Eyes:     Conjunctiva/sclera: Conjunctivae normal.  Cardiovascular:     Rate and Rhythm: Normal rate and regular rhythm.  Pulmonary:     Effort: Pulmonary effort is normal. No respiratory distress.     Breath sounds: No stridor.  Abdominal:     General: There is no distension.  Genitourinary:    Comments: Patient denies complaint scrotum, penis Musculoskeletal:     Comments: Evidence for multiple prior surgeries on both feet, otherwise unremarkable musculoskeletal exam  Skin:    General: Skin is warm and dry.  Neurological:     Mental Status: He is alert and oriented to person, place, and time.     ED Results / Procedures / Treatments   Labs (all labs ordered are listed, but only abnormal results are displayed) Labs Reviewed  COMPREHENSIVE METABOLIC PANEL - Abnormal; Notable for the following components:      Result Value   Potassium 3.3 (*)    All other components within normal limits  URINALYSIS, ROUTINE W REFLEX MICROSCOPIC - Abnormal; Notable for the following components:   Hgb urine dipstick LARGE (*)    Ketones, ur 5 (*)    Bacteria, UA RARE (*)    All other  components within normal limits  CBC WITH DIFFERENTIAL/PLATELET    EKG None  Radiology CT RENAL STONE STUDY  Result Date: 09/07/2022 CLINICAL DATA:  Abdominal/flank pain, stone suspected. Left lower abdominal pain with history of kidney stones. EXAM: CT ABDOMEN AND PELVIS WITHOUT CONTRAST TECHNIQUE: Multidetector CT imaging of the abdomen and pelvis was performed following the standard protocol without IV contrast. RADIATION DOSE REDUCTION: This exam was performed according to the departmental dose-optimization program which includes automated exposure control, adjustment of the mA and/or kV according to patient size and/or use of iterative  reconstruction technique. COMPARISON:  CT abdomen/pelvis 11/03/2021. FINDINGS: Lower chest: No acute abnormality. Hepatobiliary: No focal liver abnormality is seen. No gallstones, gallbladder wall thickening, or biliary dilatation. Pancreas: Unremarkable. No pancreatic ductal dilatation or surrounding inflammatory changes. Spleen: Normal in size without focal abnormality. Adrenals/Urinary Tract: Adrenals are unremarkable. 2 mm calculus at the left ureterovesicular junction with associated mild left hydroureteronephrosis. Normal right kidney. No other stones are seen. Stomach/Bowel: Normal stomach and duodenum. No dilated loops of small bowel. Normal appendix is seen on coronal image 46 series 5. No bowel wall thickening or surrounding inflammation. Vascular/Lymphatic: No significant vascular findings are present. No enlarged abdominal or pelvic lymph nodes. Reproductive: Prostate is unremarkable. Other: No abdominal wall hernia or abnormality. No abdominopelvic ascites. Musculoskeletal: No acute or significant osseous findings. IMPRESSION: 2 mm calculus at the left ureterovesicular junction with associated mild left hydroureteronephrosis. Electronically Signed   By: Emmit Alexanders M.D.   On: 09/07/2022 09:53    Procedures Procedures    Medications Ordered in ED Medications  0.9 %  sodium chloride infusion ( Intravenous New Bag/Given 09/07/22 0852)  fentaNYL (SUBLIMAZE) injection 25 mcg (25 mcg Intravenous Given 09/07/22 0850)  ondansetron (ZOFRAN) injection 4 mg (4 mg Intravenous Given 09/07/22 0850)  ketorolac (TORADOL) 30 MG/ML injection 15 mg (15 mg Intravenous Given 09/07/22 1039)  sodium chloride 0.9 % bolus 1,000 mL (1,000 mLs Intravenous Bolus 09/07/22 1040)    ED Course/ Medical Decision Making/ A&P                             Medical Decision Making Patient presents with left lower quadrant, left flank pain for 1 week.  Differential includes stone, GI pathology, musculoskeletal.  Denying  complaints of scrotum, penile pain, lower suspicion for epididymitis, torsion. Patient had CT scan, labs, received IV fentanyl, Zofran, fluids.  Amount and/or Complexity of Data Reviewed External Data Reviewed: notes. Labs: ordered. Decision-making details documented in ED Course. Radiology: ordered and independent interpretation performed. Decision-making details documented in ED Course.  Risk Prescription drug management.   11:43 AM Patient in no distress, ambulatory.  I have reviewed his CT scan with him, evidence for 2 mm left-sided stone, no evidence for obstruction and urinalysis results are inconsistent with infection.  He has mild tachycardia, but symptoms have generally improved, no evidence of bacteremia, sepsis, patient appropriate for discharge with outpatient follow-up.        Final Clinical Impression(s) / ED Diagnoses Final diagnoses:  Nephrolithiasis    Rx / DC Orders ED Discharge Orders          Ordered    ibuprofen (ADVIL) 400 MG tablet  3 times daily        09/07/22 1141    ondansetron (ZOFRAN-ODT) 4 MG disintegrating tablet  Every 8 hours PRN        09/07/22 1141  Carmin Muskrat, MD 09/07/22 [phone]

## 2022-09-07 NOTE — ED Triage Notes (Signed)
Pt c/o lower L abdominal pain 7/10 since last Thursday , states he believes it is a kidney stone as he has had kidneys stones before and the pain feels similar, endorses N/V

## 2022-09-07 NOTE — ED Notes (Signed)
Pt given a urinal and informed of need for urine specimen

## 2022-09-07 NOTE — Discharge Instructions (Signed)
You have been diagnosed with a kidney stone.  Your kidney stone is 2 mm, and typically stones of this size pass spontaneously, though typically with pain.  Please take all medication as directed and monitor your condition carefully.  Do not hesitate to return here for concerning changes in your condition.

## 2022-09-12 NOTE — Progress Notes (Signed)
Received pt's pcp H&P , dated 09-09-2022, placed w/ chart.

## 2022-09-12 NOTE — H&P (View-Only) (Signed)
Received pt's pcp H&P , dated 09-09-2022, placed w/ chart. 

## 2022-09-22 ENCOUNTER — Encounter (HOSPITAL_BASED_OUTPATIENT_CLINIC_OR_DEPARTMENT_OTHER): Payer: Self-pay | Admitting: Podiatry

## 2022-09-22 NOTE — Progress Notes (Signed)
Spoke w/ via phone for pre-op interview--- Grandmother  Kevin Leonard and Kevin Leonard (legal guardians) Lab needs dos----   NONE            Lab results------ COVID test -----patient states asymptomatic no test needed Arrive at -------0900 NPO after MN NO Solid Food.  Clear liquids from MN until---0800 Med rec completed Medications to take morning of surgery -----NONE Diabetic medication ----- Patient instructed no nail polish to be worn day of surgery Patient instructed to bring photo id and insurance card day of surgery Patient aware to have Driver (ride ) / caregiver  [name] and [name]  for 24 hours after surgery  Patient Special Instructions ----- Pre-Op special Istructions ----- Patient verbalized understanding of instructions that were given at this phone interview. Patient denies shortness of breath, chest pain, fever, cough at this phone interview.  Per chart grandparents are legal guardians Kevin Leonard and Kevin Leonard) per grandmother patient has signed own consents in the past.

## 2022-09-26 ENCOUNTER — Ambulatory Visit (HOSPITAL_BASED_OUTPATIENT_CLINIC_OR_DEPARTMENT_OTHER): Payer: Medicaid Other | Admitting: Anesthesiology

## 2022-09-26 ENCOUNTER — Ambulatory Visit (HOSPITAL_COMMUNITY): Payer: Medicaid Other

## 2022-09-26 ENCOUNTER — Encounter: Payer: Self-pay | Admitting: Podiatry

## 2022-09-26 ENCOUNTER — Encounter (HOSPITAL_BASED_OUTPATIENT_CLINIC_OR_DEPARTMENT_OTHER): Payer: Self-pay | Admitting: Podiatry

## 2022-09-26 ENCOUNTER — Ambulatory Visit (HOSPITAL_BASED_OUTPATIENT_CLINIC_OR_DEPARTMENT_OTHER)
Admission: RE | Admit: 2022-09-26 | Discharge: 2022-09-26 | Disposition: A | Discharge status: Home / Self Care | Payer: Medicaid Other | Attending: Podiatry | Admitting: Podiatry

## 2022-09-26 ENCOUNTER — Other Ambulatory Visit: Payer: Self-pay

## 2022-09-26 ENCOUNTER — Other Ambulatory Visit: Payer: Self-pay | Admitting: Podiatry

## 2022-09-26 ENCOUNTER — Encounter (HOSPITAL_BASED_OUTPATIENT_CLINIC_OR_DEPARTMENT_OTHER)
Admission: RE | Disposition: A | Discharge status: Home / Self Care | Payer: Self-pay | Source: Home / Self Care | Attending: Podiatry

## 2022-09-26 DIAGNOSIS — Y798 Miscellaneous orthopedic devices associated with adverse incidents, not elsewhere classified: Secondary | ICD-10-CM | POA: Insufficient documentation

## 2022-09-26 DIAGNOSIS — M960 Pseudarthrosis after fusion or arthrodesis: Secondary | ICD-10-CM

## 2022-09-26 DIAGNOSIS — T8484XA Pain due to internal orthopedic prosthetic devices, implants and grafts, initial encounter: Secondary | ICD-10-CM | POA: Insufficient documentation

## 2022-09-26 DIAGNOSIS — Z4889 Encounter for other specified surgical aftercare: Secondary | ICD-10-CM | POA: Diagnosis not present

## 2022-09-26 DIAGNOSIS — Y838 Other surgical procedures as the cause of abnormal reaction of the patient, or of later complication, without mention of misadventure at the time of the procedure: Secondary | ICD-10-CM | POA: Insufficient documentation

## 2022-09-26 HISTORY — PX: MINOR HARDWARE REMOVAL: SHX6474

## 2022-09-26 HISTORY — DX: Personal history of urinary calculi: Z87.442

## 2022-09-26 HISTORY — PX: FUSION OF TALONAVICULAR JOINT: SHX6332

## 2022-09-26 SURGERY — FUSION, TALONAVICULAR JOINT
Anesthesia: Monitor Anesthesia Care | Site: Foot | Laterality: Left

## 2022-09-26 MED ORDER — PROPOFOL 500 MG/50ML IV EMUL
INTRAVENOUS | Status: AC
  Filled 2022-09-26: qty 50

## 2022-09-26 MED ORDER — CLONIDINE HCL (ANALGESIA) 100 MCG/ML EP SOLN
EPIDURAL | Status: DC | PRN
  Administered 2022-09-26: 100 ug

## 2022-09-26 MED ORDER — MIDAZOLAM HCL 2 MG/2ML IJ SOLN
INTRAMUSCULAR | Status: AC
  Filled 2022-09-26: qty 2

## 2022-09-26 MED ORDER — ROPIVACAINE HCL 7.5 MG/ML IJ SOLN
INTRAMUSCULAR | Status: DC | PRN
  Administered 2022-09-26 (×4): 5 mL via PERINEURAL

## 2022-09-26 MED ORDER — IBUPROFEN 800 MG PO TABS: 800.0000 mg | ORAL_TABLET | Freq: Four times a day (QID) | ORAL | 1 refills | Status: AC | PRN

## 2022-09-26 MED ORDER — ONDANSETRON HCL 4 MG/2ML IJ SOLN
INTRAMUSCULAR | Status: DC | PRN
  Administered 2022-09-26: 4 mg via INTRAVENOUS

## 2022-09-26 MED ORDER — 0.9 % SODIUM CHLORIDE (POUR BTL) OPTIME
TOPICAL | Status: DC | PRN
  Administered 2022-09-26: 500 mL

## 2022-09-26 MED ORDER — LIDOCAINE HCL (PF) 1 % IJ SOLN
INTRAMUSCULAR | Status: DC | PRN
  Administered 2022-09-26: 10 mL via SURGICAL_CAVITY

## 2022-09-26 MED ORDER — OXYCODONE HCL 5 MG/5ML PO SOLN: 5.0000 mg | Freq: Once | ORAL | Status: DC | PRN

## 2022-09-26 MED ORDER — FENTANYL CITRATE (PF) 100 MCG/2ML IJ SOLN
100.0000 ug | Freq: Once | INTRAMUSCULAR | Status: AC
  Administered 2022-09-26: 100 ug via INTRAVENOUS

## 2022-09-26 MED ORDER — ACETAMINOPHEN 160 MG/5ML PO SOLN: 325.0000 mg | ORAL | Status: DC | PRN

## 2022-09-26 MED ORDER — ACETAMINOPHEN 325 MG PO TABS: 325.0000 mg | ORAL_TABLET | ORAL | Status: DC | PRN

## 2022-09-26 MED ORDER — MIDAZOLAM HCL 2 MG/2ML IJ SOLN
INTRAMUSCULAR | Status: DC | PRN
  Administered 2022-09-26: 2 mg via INTRAVENOUS

## 2022-09-26 MED ORDER — FENTANYL CITRATE (PF) 100 MCG/2ML IJ SOLN: 25.0000 ug | INTRAMUSCULAR | Status: DC | PRN

## 2022-09-26 MED ORDER — KETOROLAC TROMETHAMINE 30 MG/ML IJ SOLN: 30.0000 mg | Freq: Once | INTRAMUSCULAR | Status: DC | PRN

## 2022-09-26 MED ORDER — FENTANYL CITRATE (PF) 100 MCG/2ML IJ SOLN
INTRAMUSCULAR | Status: AC
  Filled 2022-09-26: qty 2

## 2022-09-26 MED ORDER — CEFAZOLIN SODIUM-DEXTROSE 2-4 GM/100ML-% IV SOLN
2.0000 g | Freq: Once | INTRAVENOUS | Status: AC
Start: 2022-09-26 — End: 2022-09-26
  Administered 2022-09-26: 2 g via INTRAVENOUS

## 2022-09-26 MED ORDER — MEPERIDINE HCL 25 MG/ML IJ SOLN: 6.2500 mg | INTRAMUSCULAR | Status: DC | PRN

## 2022-09-26 MED ORDER — ONDANSETRON HCL 4 MG/2ML IJ SOLN: 4.0000 mg | Freq: Once | INTRAMUSCULAR | Status: DC | PRN

## 2022-09-26 MED ORDER — CEFAZOLIN SODIUM-DEXTROSE 2-4 GM/100ML-% IV SOLN
INTRAVENOUS | Status: AC
  Filled 2022-09-26: qty 100

## 2022-09-26 MED ORDER — DEXAMETHASONE SODIUM PHOSPHATE 10 MG/ML IJ SOLN
INTRAMUSCULAR | Status: DC | PRN
  Administered 2022-09-26: 10 mg

## 2022-09-26 MED ORDER — OXYCODONE HCL 5 MG PO TABS: 5.0000 mg | ORAL_TABLET | Freq: Once | ORAL | Status: DC | PRN

## 2022-09-26 MED ORDER — PROPOFOL 1000 MG/100ML IV EMUL
INTRAVENOUS | Status: AC
  Filled 2022-09-26: qty 200

## 2022-09-26 MED ORDER — MIDAZOLAM HCL 2 MG/2ML IJ SOLN
2.0000 mg | Freq: Once | INTRAMUSCULAR | Status: AC
  Administered 2022-09-26: 2 mg via INTRAVENOUS

## 2022-09-26 MED ORDER — LACTATED RINGERS IV SOLN: INTRAVENOUS | Status: DC

## 2022-09-26 MED ORDER — DEXMEDETOMIDINE HCL IN NACL 80 MCG/20ML IV SOLN
INTRAVENOUS | Status: DC | PRN
  Administered 2022-09-26: 8 ug via BUCCAL

## 2022-09-26 MED ORDER — OXYCODONE-ACETAMINOPHEN 5-325 MG PO TABS: 1.0000 | ORAL_TABLET | ORAL | 0 refills | Status: AC | PRN

## 2022-09-26 MED ORDER — PROPOFOL 500 MG/50ML IV EMUL
INTRAVENOUS | Status: DC | PRN
  Administered 2022-09-26: 75 ug/kg/min via INTRAVENOUS

## 2022-09-26 SURGICAL SUPPLY — 71 items
2.4 mm drill IMPLANT
APL PRP STRL LF DISP 70% ISPRP (MISCELLANEOUS) ×1
BIT DRILL LEOS 2.4 (BIT) IMPLANT
BLADE AVERAGE 25X9 (BLADE) IMPLANT
BLADE OSC/SAG .038X5.5 CUT EDG (BLADE) IMPLANT
BLADE OSCILLATING SAW SHORT (MISCELLANEOUS) IMPLANT
BLADE OSCILLATING/SAGITTAL (BLADE)
BLADE SURG 15 STRL LF DISP TIS (BLADE) ×2 IMPLANT
BLADE SURG 15 STRL SS (BLADE) ×2
BLADE SW THK.38XMED LNG THN (BLADE) IMPLANT
BNDG CMPR 9X4 STRL LF SNTH (GAUZE/BANDAGES/DRESSINGS) ×1
BNDG ELASTIC 4X5.8 VLCR STR LF (GAUZE/BANDAGES/DRESSINGS) ×1 IMPLANT
BNDG ESMARK 4X9 LF (GAUZE/BANDAGES/DRESSINGS) ×1 IMPLANT
BNDG GAUZE DERMACEA FLUFF 4 (GAUZE/BANDAGES/DRESSINGS) ×1 IMPLANT
BNDG GZE DERMACEA 4 6PLY (GAUZE/BANDAGES/DRESSINGS) ×1
BUR OVAL CARBIDE 4.0 (BURR) IMPLANT
CHLORAPREP W/TINT 26 (MISCELLANEOUS) ×1 IMPLANT
COVER BACK TABLE 60X90IN (DRAPES) ×1 IMPLANT
CUFF TOURN SGL QUICK 18X4 (TOURNIQUET CUFF) IMPLANT
DRAPE C-ARM 42X120 X-RAY (DRAPES) IMPLANT
DRAPE C-ARMOR (DRAPES) IMPLANT
DRAPE EXTREMITY T 121X128X90 (DISPOSABLE) ×1 IMPLANT
DRAPE IMP U-DRAPE 54X76 (DRAPES) ×1 IMPLANT
DRAPE OEC MINIVIEW 54X84 (DRAPES) ×1 IMPLANT
DRAPE SHEET LG 3/4 BI-LAMINATE (DRAPES) IMPLANT
DRAPE U-SHAPE 47X51 STRL (DRAPES) ×1 IMPLANT
ELECT REM PT RETURN 9FT ADLT (ELECTROSURGICAL) ×1
ELECTRODE REM PT RTRN 9FT ADLT (ELECTROSURGICAL) ×1 IMPLANT
FIBER SELECT OSTEOAMP 2.5 (Tissue) IMPLANT
GAUZE SPONGE 4X4 12PLY STRL (GAUZE/BANDAGES/DRESSINGS) ×1 IMPLANT
GAUZE XEROFORM 1X8 LF (GAUZE/BANDAGES/DRESSINGS) ×1 IMPLANT
GLOVE BIO SURGEON STRL SZ7 (GLOVE) ×1 IMPLANT
GLOVE BIOGEL PI IND STRL 7.5 (GLOVE) ×1 IMPLANT
GOWN STRL REUS W/ TWL XL LVL3 (GOWN DISPOSABLE) ×1 IMPLANT
GOWN STRL REUS W/TWL XL LVL3 (GOWN DISPOSABLE) ×1
K-WIRE .062 (WIRE)
K-WIRE FX6X.062X2 END TROC (WIRE)
KIT INSTRUMENT DYNAFORCE PLATE (KITS) IMPLANT
KIT TURNOVER CYSTO (KITS) ×1 IMPLANT
KWIRE FX6X.062X2 END TROC (WIRE) IMPLANT
KWIRE LEOS SMOOTH 1.6 (WIRE) IMPLANT
MANIFOLD NEPTUNE II (INSTRUMENTS) ×1 IMPLANT
NDL FILTER BLUNT 18X1 1/2 (NEEDLE) IMPLANT
NDL HYPO 25X1 1.5 SAFETY (NEEDLE) IMPLANT
NEEDLE FILTER BLUNT 18X1 1/2 (NEEDLE) IMPLANT
NEEDLE HYPO 25X1 1.5 SAFETY (NEEDLE) ×1 IMPLANT
NS IRRIG 500ML POUR BTL (IV SOLUTION) ×1 IMPLANT
PACK BASIN DAY SURGERY FS (CUSTOM PROCEDURE TRAY) ×1 IMPLANT
PAD PREP 24X48 CUFFED NSTRL (MISCELLANEOUS) ×1 IMPLANT
PENCIL SMOKE EVACUATOR (MISCELLANEOUS) ×1 IMPLANT
PLATE 4 HOLE MEDIUM (Plate) IMPLANT
PLATE TACK LEOS 20 (WIRE) IMPLANT
Plate tack, 20 mm IMPLANT
RASP SM TEAR CROSS CUT (RASP) IMPLANT
SCREW LEOS LOCK 3.5X16 (Screw) IMPLANT
SCREW LEOS NL 3.5X16 (Screw) IMPLANT
SLEEVE SCD COMPRESS KNEE MED (STOCKING) ×1 IMPLANT
STAPLE HIMAX KIT 25X22X22 (Staple) IMPLANT
STAPLER VISISTAT 35W (STAPLE) IMPLANT
STOCKINETTE 4X48 STRL (DRAPES) ×1 IMPLANT
SUCTION FRAZIER HANDLE 10FR (MISCELLANEOUS) ×1
SUCTION TUBE FRAZIER 10FR DISP (MISCELLANEOUS) ×1 IMPLANT
SUT MNCRL AB 3-0 PS2 27 (SUTURE) ×1 IMPLANT
SUT MNCRL AB 4-0 PS2 18 (SUTURE) ×1 IMPLANT
SUT MON AB 5-0 PS2 18 (SUTURE) IMPLANT
SUT PROLENE 3 0 PS 2 (SUTURE) ×1 IMPLANT
SYR 10ML LL (SYRINGE) IMPLANT
SYR BULB EAR ULCER 3OZ GRN STR (SYRINGE) ×1 IMPLANT
Smooth K-wire 1.6mm IMPLANT
TACK PLATE LEOS 20 (WIRE) ×2
TUBE CONNECTING 12X1/4 (SUCTIONS) ×1 IMPLANT

## 2022-09-26 NOTE — Anesthesia Preprocedure Evaluation (Addendum)
Anesthesia Evaluation  Patient identified by MRN, date of birth, ID band Patient awake    Reviewed: Allergy & Precautions, H&P , NPO status , Patient's Chart, lab work & pertinent test results  Airway Mallampati: I      Comment: Prognathia  Dental  (+) Teeth Intact   Pulmonary neg pulmonary ROS   Pulmonary exam normal        Cardiovascular Exercise Tolerance: Good negative cardio ROS Normal cardiovascular exam     Neuro/Psych negative neurological ROS  negative psych ROS   GI/Hepatic negative GI ROS, Neg liver ROS,,,  Endo/Other  negative endocrine ROS    Renal/GU negative Renal ROS  negative genitourinary   Musculoskeletal   Abdominal Normal abdominal exam  (+)   Peds  Hematology negative hematology ROS (+)   Anesthesia Other Findings   Reproductive/Obstetrics negative OB ROS                             Anesthesia Physical Anesthesia Plan  ASA: 1  Anesthesia Plan: MAC and Regional   Post-op Pain Management: GA combined w/ Regional for post-op pain   Induction:   PONV Risk Score and Plan: 2 and Ondansetron, Dexamethasone and Midazolam  Airway Management Planned: Natural Airway  Additional Equipment: None  Intra-op Plan:   Post-operative Plan:   Informed Consent: I have reviewed the patients History and Physical, chart, labs and discussed the procedure including the risks, benefits and alternatives for the proposed anesthesia with the patient or authorized representative who has indicated his/her understanding and acceptance.     Dental Advisory Given  Plan Discussed with: CRNA  Anesthesia Plan Comments:         Anesthesia Quick Evaluation

## 2022-09-26 NOTE — Anesthesia Postprocedure Evaluation (Signed)
Anesthesia Post Note  Patient: ANTONIOUS KORT  Procedure(s) Performed: FUSION OF TALONAVICULAR JOINT (Left: Foot) MINOR HARDWARE REMOVAL (Left: Foot)     Patient location during evaluation: PACU Anesthesia Type: Regional and MAC Level of consciousness: awake Pain management: pain level controlled Vital Signs Assessment: post-procedure vital signs reviewed and stable Respiratory status: spontaneous breathing Cardiovascular status: stable Postop Assessment: no apparent nausea or vomiting Anesthetic complications: no  No notable events documented.  Last Vitals:  Vitals:   09/26/22 1400 09/26/22 1417  BP: (!) 89/46 (!) 90/49  Pulse: (!) 57 (!) 58  Resp: 17 17  Temp:    SpO2: 100% 100%    Last Pain: There were no vitals filed for this visit.               Huston Foley

## 2022-09-26 NOTE — Progress Notes (Signed)
Assisted Dr. Jillyn Hidden with left, popliteal, ultrasound guided block. Side rails up, monitors on throughout procedure. See vital signs in flow sheet. Tolerated Procedure well.

## 2022-09-26 NOTE — Anesthesia Procedure Notes (Signed)
Anesthesia Regional Block: Popliteal block   Pre-Anesthetic Checklist: , timeout performed,  Correct Patient, Correct Site, Correct Laterality,  Correct Procedure, Correct Position, site marked,  Risks and benefits discussed,  Surgical consent,  Pre-op evaluation,  At surgeon's request and post-op pain management  Laterality: Lower and Left  Prep: chloraprep       Needles:  Injection technique: Single-shot  Needle Type: Echogenic Stimulator Needle     Needle Length: 9cm  Needle Gauge: 20   Needle insertion depth: 2.5 cm   Additional Needles:   Procedures:,,,, ultrasound used (permanent image in chart),,    Narrative:  Start time: 09/26/2022 11:15 AM End time: 09/26/2022 11:22 AM Injection made incrementally with aspirations every 5 mL.  Performed by: Personally  Anesthesiologist: Lyn Hollingshead, MD

## 2022-09-26 NOTE — Progress Notes (Signed)
Orthopedic Tech Progress Note Patient Details:  Kevin Leonard 11-13-2000 [id number]  Ortho Devices Type of Ortho Device: CAM walker Ortho Device/Splint Location: LLE Ortho Device/Splint Interventions: Application, Ordered, Adjustment   Post Interventions Patient Tolerated: Well  [name] 09/26/2022, 3:23 PM

## 2022-09-26 NOTE — Op Note (Signed)
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 male presents with painful nonunion of talonavicular joint left. Patient has failed all conservative therapy including but not limited to shoe gear modification injection and offloading. He wishes to have surgical correction of the foot/deformity. It was determined that patient would benefit from left talonavicular joint revisional arthrodesis with bone graft. Informed surgical risk consent was reviewed and read aloud to the patient.  I reviewed the films.  I have discussed my findings with the patient in great detail.  I have discussed all risks including but not limited to infection, stiffness, scarring, limp, disability, deformity, damage to blood vessels and nerves, numbness, poor healing, need for braces, arthritis, chronic pain, amputation, death.  All benefits and realistic expectations discussed in great detail.  I have made no promises as to the outcome.  I have provided realistic expectations.  I have offered the patient a 2nd opinion, which they have declined and assured me they preferred to proceed despite the risks   Procedure in detail: The patient was both verbally and visually identified by  myself, the nursing staff, and anesthesia staff in the preoperative holding area. They were then transferred to the operating room and placed on the operative table in supine position.  Attention was directed to the previous foot, using K wire and small stab incisions were made to remove the previous hardware that was in the talonavicular joint.  There were removed in its entirety no complication noted.   A separate longitudinal incision was made medially just distal to the medial malleolus and along the medial column of the foot. Sharp dissection was carried down through the skin and subcutaneous tissues down to the level of the interval between the tibialis post into the tendons. The interval was explored proximally and distally and provided access to the talonavicular and naviculocuneiform joints.   access to the talonavicular joint was then obtained and debridement of the lateral side cartilage surfaces was performed.  Medial cartilage resection was adequate.  Subchondral bone was penetrated both on the talar head and on the navicular and good removal of the cartilage was noted. Following complete cartilage removal, the subchondral bone was penetrated using multiple drill bits and osteotomes to feather the surfaces as well as to provide good bleeding subchondral bone. The talonavicular joint was then secured using 0.062 guidewires with the compression device to maintain compression at all times while fixation was being placed. The guidewire was secured, and a Crossroads staple was then placed on the dorsal lateral surface to provide lateral compression.  This was followed by a medial plate across the talonavicular joint to maintain compression.  The compression device was removed excellent compression was noted across the fusion site. Good placement of the hardware was noted under fluoroscopic visualization in the AP and lateral planes of the foot. Wounds were then copiously  irrigated with normal saline and  closed in layers with 2-0 and 3-0 monocryl and skin closed with skin staple. Sterile dressings were applied, followed placement in CAM boot Non-weightbearing. The patient was awakened and taken to recovery room in good condition. The patient tolerated the procedure well. All counts were correct. There were no complications.     At the conclusion of the procedure the patient was awoken from anesthesia and found to have tolerated the procedure well any complications. There were transferred to PACU with vital signs stable and vascular status intact.  Boneta Lucks, DPM

## 2022-09-26 NOTE — Interval H&P Note (Signed)
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  The various methods of treatment have been discussed with the patient and family. After consideration of risks, benefits and other options for treatment, the patient has consented to  Procedure(s) with comments: FUSION OF TALONAVICULAR JOINT (Left) - POPLITEAL BLOCK MINOR HARDWARE REMOVAL (Left) as a surgical intervention.  The patient's history has been reviewed, patient examined, no change in status, stable for surgery.  I have reviewed the patient's chart and labs.  Questions were answered to the patient's satisfaction.     Felipa Furnace

## 2022-09-26 NOTE — Discharge Instructions (Addendum)
After Surgery Instructions ° ° °1) If you are recuperating from surgery anywhere other than home, please be sure to leave us the number where you can be reached. ° °2) Go directly home and rest. ° °3) Keep the operated foot(feet) elevated six inches above the hip when sitting or lying down. This will help control swelling and pain. ° °4) Support the elevated foot and leg with pillows. DO NOT PLACE PILLOWS UNDER THE KNEE. ° °5) DO NOT REMOVE or get your bandages WET, unless you were given different instructions by your doctor to do so. This increases the risk of infection. ° °6) Wear your surgical shoe or surgical boot at all times when you are up on your feet. ° °7) A limited amount of pain and swelling may occur. The skin may take on a bruised appearance. DO NOT BE ALARMED, THIS IS NORMAL. ° °8) For slight pain and swelling, apply an ice pack directly over the bandages for 15 minutes only out of each hour of the day. Continue until seen in the office for your first post op visit. DON NOT     APPLY ANY FORM OF HEAT TO THE AREA. ° °9) Have prescriptions filled immediately and take as directed. ° °10) Drink lots of liquids, water and juice to stay hydrated. ° °11) CALL IMMEDIATELY IF: ° *Bleeding continues until the following day of surgery ° *Pain increases and/or does not respond to medication ° *Bandages or cast appears to tight ° *If your bandage gets wet ° *Trip, fall or stump your surgical foot ° *If your temperature goes above 101 ° *If you have ANY questions at all ° °YOU NOW CONTROL THE EFFORT OF YOUR RECOVERY. ADHERING TO THESE INSTRUCTIONS WILL OFFER YOU THE MOST COMPLETE RESULTS  ° ° °Post Anesthesia Home Care Instructions ° °Activity: °Get plenty of rest for the remainder of the day. A responsible adult should stay with you for 24 hours following the procedure.  °For the next 24 hours, DO NOT: °-Drive a car °-Operate machinery °-Drink alcoholic beverages °-Take any medication unless instructed by your  physician °-Make any legal decisions or sign important papers. ° °Meals: °Start with liquid foods such as gelatin or soup. Progress to regular foods as tolerated. Avoid greasy, spicy, heavy foods. If nausea and/or vomiting occur, drink only clear liquids until the nausea and/or vomiting subsides. Call your physician if vomiting continues. ° °Special Instructions/Symptoms: °Your throat may feel dry or sore from the anesthesia or the breathing tube placed in your throat during surgery. If this causes discomfort, gargle with warm salt water. The discomfort should disappear within 24 hours. ° °

## 2022-09-26 NOTE — Anesthesia Procedure Notes (Signed)
Procedure Name: MAC Date/Time: 09/26/2022 11:51 AM  Performed by: Bonney Aid, CRNAPre-anesthesia Checklist: Patient identified, Emergency Drugs available, Suction available, Patient being monitored and Timeout performed Patient Re-evaluated:Patient Re-evaluated prior to induction Oxygen Delivery Method: Simple face mask Placement Confirmation: positive ETCO2

## 2022-09-26 NOTE — Transfer of Care (Signed)
Immediate Anesthesia Transfer of Care Note  Patient: Kevin Leonard  Procedure(s) Performed: FUSION OF TALONAVICULAR JOINT (Left: Foot) MINOR HARDWARE REMOVAL (Left: Foot)  Patient Location: PACU  Anesthesia Type:MAC  Level of Consciousness: drowsy and patient cooperative  Airway & Oxygen Therapy: Patient Spontanous Breathing and Patient connected to face mask oxygen  Post-op Assessment: Report given to RN and Post -op Vital signs reviewed and stable  Post vital signs: Reviewed and stable  Last Vitals:  Vitals Value Taken Time  BP 91/46 09/26/22 1341  Temp 36.4 C 09/26/22 1342  Pulse 73 09/26/22 1344  Resp 18 09/26/22 1344  SpO2 100 % 09/26/22 1344  Vitals shown include unvalidated device data.  Last Pain: There were no vitals filed for this visit.       Complications: No notable events documented.

## 2022-09-28 ENCOUNTER — Encounter (HOSPITAL_BASED_OUTPATIENT_CLINIC_OR_DEPARTMENT_OTHER): Payer: Self-pay | Admitting: Podiatry

## 2022-10-04 ENCOUNTER — Ambulatory Visit (INDEPENDENT_AMBULATORY_CARE_PROVIDER_SITE_OTHER): Payer: Medicaid Other

## 2022-10-04 DIAGNOSIS — Q6652 Congenital pes planus, left foot: Secondary | ICD-10-CM

## 2022-10-04 NOTE — Progress Notes (Signed)
POV #1 DOS 09/26/2022 LT REVISION TALONAVICULAR JOINT FUSION W/REMOVAL OF HARDWARE   Pt states he has been doing well. Dressing up removed. There was active bleeding under bandage. X-Rays were obtained and reviewed by Dr. Sherryle Lis.   Dr. Sherryle Lis also examined pt surgical site and applied 3 steri strips, followed by kerlex and a ace wrap.   Pt will follow up with Dr. Posey Pronto.

## 2022-10-19 ENCOUNTER — Encounter: Payer: Medicaid Other | Admitting: Podiatry

## 2022-10-19 ENCOUNTER — Ambulatory Visit (INDEPENDENT_AMBULATORY_CARE_PROVIDER_SITE_OTHER): Payer: Medicaid Other | Admitting: Podiatry

## 2022-10-19 DIAGNOSIS — Z9889 Other specified postprocedural states: Secondary | ICD-10-CM

## 2022-10-19 DIAGNOSIS — M960 Pseudarthrosis after fusion or arthrodesis: Secondary | ICD-10-CM

## 2022-10-19 NOTE — Progress Notes (Unsigned)
  Subjective:  Patient ID: Kevin Leonard, male    DOB: 07-07-2000,  MRN: [id number]  Chief Complaint  Patient presents with   Routine Post Op    POV #2 DOS 09/26/2022 LT REVISION TALONAVICULAR JOINT FUSION W/REMOVAL OF HARDWARE    DOS: 09/26/2022 Procedure: Left revisional talonavicular joint fusion with removal of previous hardware  22 y.o. male returns for post-op check.  Patient states that he is doing well.  Pain is controlled.  Has been nonweightbearing to the left lower extremity  Review of Systems: Negative except as noted in the HPI. Denies N/V/F/Ch.  Past Medical History:  Diagnosis Date   History of kidney stones    Pes planus, congenital    bilaterally    Current Outpatient Medications:    doxycycline (VIBRAMYCIN) 100 MG capsule, Take 1 capsule (100 mg total) by mouth 2 (two) times daily. (Patient not taking: Reported on 09/07/2022), Disp: 20 capsule, Rfl: 0   ibuprofen (ADVIL) 800 MG tablet, Take 1 tablet (800 mg total) by mouth every 6 (six) hours as needed., Disp: 60 tablet, Rfl: 1   ondansetron (ZOFRAN-ODT) 4 MG disintegrating tablet, Take 1 tablet (4 mg total) by mouth every 8 (eight) hours as needed for nausea or vomiting., Disp: 20 tablet, Rfl: 0   oxyCODONE (ROXICODONE) 5 MG immediate release tablet, Take 1 tablet (5 mg total) by mouth every 4 (four) hours as needed for severe pain. (Patient not taking: Reported on 09/07/2022), Disp: 5 tablet, Rfl: 0   oxyCODONE-acetaminophen (PERCOCET) 5-325 MG tablet, Take 1 tablet by mouth every 4 (four) hours as needed for severe pain., Disp: 30 tablet, Rfl: 0  Social History   Tobacco Use  Smoking Status Never  Smokeless Tobacco Never    No Known Allergies Objective:  There were no vitals filed for this visit. There is no height or weight on file to calculate BMI. Constitutional Well developed. Well nourished.  Vascular Foot warm and well perfused. Capillary refill normal to all digits.   Neurologic Normal  speech. Oriented to person, place, and time. Epicritic sensation to light touch grossly present bilaterally.  Dermatologic Skin healing well without signs of infection. Skin edges well coapted without signs of infection.  Orthopedic: Tenderness to palpation noted about the surgical site.   Radiographs: 3 views of skeletally mature adult left foot: Hardware is intact.  Reduction of talonavicular joint noted.  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 Status post foot surgery    Plan:  Patient was evaluated and treated and all questions answered.  S/p foot surgery left -Progressing as expected post-operatively. -XR: See above -WB Status: Nonweightbearing in left lower extremity with crutches -Sutures: Intact.  No clinical signs of Deis is noted no complication noted. -Medications: None -Foot redressed.  No follow-ups on file.

## 2022-11-02 ENCOUNTER — Encounter: Payer: Medicaid Other | Admitting: Podiatry

## 2022-11-16 ENCOUNTER — Ambulatory Visit (INDEPENDENT_AMBULATORY_CARE_PROVIDER_SITE_OTHER): Payer: Medicaid Other | Admitting: Podiatry

## 2022-11-16 ENCOUNTER — Ambulatory Visit (INDEPENDENT_AMBULATORY_CARE_PROVIDER_SITE_OTHER): Payer: Medicaid Other

## 2022-11-16 DIAGNOSIS — Z9889 Other specified postprocedural states: Secondary | ICD-10-CM

## 2022-11-16 DIAGNOSIS — M960 Pseudarthrosis after fusion or arthrodesis: Secondary | ICD-10-CM

## 2022-11-16 NOTE — Progress Notes (Signed)
  Subjective:  Patient ID: Kevin Leonard, male    DOB: 08-16-2000,  MRN: [id number]  Chief Complaint  Patient presents with   Routine Post Op    POV #3 DOS 09/26/2022 LT REVISION TALONAVICULAR JOINT FUSION W/REMOVAL OF HARDWARE    DOS: 09/26/2022 Procedure: Left revisional talonavicular joint fusion with removal of previous hardware  22 y.o. male returns for post-op check.  Patient states that he is doing well.  Pain is controlled.  Has been nonweightbearing to the left lower extremity  Review of Systems: Negative except as noted in the HPI. Denies N/V/F/Ch.  Past Medical History:  Diagnosis Date   History of kidney stones    Pes planus, congenital    bilaterally    Current Outpatient Medications:    doxycycline (VIBRAMYCIN) 100 MG capsule, Take 1 capsule (100 mg total) by mouth 2 (two) times daily. (Patient not taking: Reported on 09/07/2022), Disp: 20 capsule, Rfl: 0   ibuprofen (ADVIL) 800 MG tablet, Take 1 tablet (800 mg total) by mouth every 6 (six) hours as needed., Disp: 60 tablet, Rfl: 1   ondansetron (ZOFRAN-ODT) 4 MG disintegrating tablet, Take 1 tablet (4 mg total) by mouth every 8 (eight) hours as needed for nausea or vomiting., Disp: 20 tablet, Rfl: 0   oxyCODONE (ROXICODONE) 5 MG immediate release tablet, Take 1 tablet (5 mg total) by mouth every 4 (four) hours as needed for severe pain. (Patient not taking: Reported on 09/07/2022), Disp: 5 tablet, Rfl: 0   oxyCODONE-acetaminophen (PERCOCET) 5-325 MG tablet, Take 1 tablet by mouth every 4 (four) hours as needed for severe pain., Disp: 30 tablet, Rfl: 0  Social History   Tobacco Use  Smoking Status Never  Smokeless Tobacco Never    No Known Allergies Objective:  There were no vitals filed for this visit. There is no height or weight on file to calculate BMI. Constitutional Well developed. Well nourished.  Vascular Foot warm and well perfused. Capillary refill normal to all digits.   Neurologic Normal  speech. Oriented to person, place, and time. Epicritic sensation to light touch grossly present bilaterally.  Dermatologic Skin healing well without signs of infection. Skin edges well coapted without signs of infection.  Good correction alignment noted  Orthopedic: Mild tenderness to palpation noted about the surgical site.   Radiographs: 3 views of skeletally mature adult left foot: Hardware is intact.  Reduction of talonavicular joint noted.  No signs of backing out or loosening noted Assessment:   1. Status post foot surgery    Plan:  Patient was evaluated and treated and all questions answered.  S/p foot surgery left -Progressing as expected post-operatively. -XR: See above -WB Status:Slowly begin weightbearing as tolerated with Cam boot -Sutures: None -Medications: None -Patient states that he will do physical therapy at home as he has done physical therapy and has improvements -  No follow-ups on file.

## 2022-12-14 ENCOUNTER — Ambulatory Visit (INDEPENDENT_AMBULATORY_CARE_PROVIDER_SITE_OTHER): Payer: Medicaid Other | Admitting: Podiatry

## 2022-12-14 DIAGNOSIS — Z9889 Other specified postprocedural states: Secondary | ICD-10-CM

## 2022-12-14 DIAGNOSIS — M960 Pseudarthrosis after fusion or arthrodesis: Secondary | ICD-10-CM

## 2022-12-14 NOTE — Progress Notes (Signed)
  Subjective:  Patient ID: Kevin Leonard, male    DOB: 2001/02/16,  MRN: [id number]  Chief Complaint  Patient presents with   Routine Post Op    POV #4 DOS 09/26/2022 LT REVISION TALONAVICULAR JOINT FUSION W/REMOVAL OF HARDWARE    DOS: 09/26/2022 Procedure: Left revisional talonavicular joint fusion with removal of previous hardware  22 y.o. male returns for post-op check.  Patient states that he is doing well.  Pain is controlled.  He has been weightbearing as tolerated in regular shoes to the left lower extremity  Review of Systems: Negative except as noted in the HPI. Denies N/V/F/Ch.  Past Medical History:  Diagnosis Date   History of kidney stones    Pes planus, congenital    bilaterally    Current Outpatient Medications:    doxycycline (VIBRAMYCIN) 100 MG capsule, Take 1 capsule (100 mg total) by mouth 2 (two) times daily. (Patient not taking: Reported on 09/07/2022), Disp: 20 capsule, Rfl: 0   ibuprofen (ADVIL) 800 MG tablet, Take 1 tablet (800 mg total) by mouth every 6 (six) hours as needed., Disp: 60 tablet, Rfl: 1   ondansetron (ZOFRAN-ODT) 4 MG disintegrating tablet, Take 1 tablet (4 mg total) by mouth every 8 (eight) hours as needed for nausea or vomiting., Disp: 20 tablet, Rfl: 0   oxyCODONE (ROXICODONE) 5 MG immediate release tablet, Take 1 tablet (5 mg total) by mouth every 4 (four) hours as needed for severe pain. (Patient not taking: Reported on 09/07/2022), Disp: 5 tablet, Rfl: 0   oxyCODONE-acetaminophen (PERCOCET) 5-325 MG tablet, Take 1 tablet by mouth every 4 (four) hours as needed for severe pain., Disp: 30 tablet, Rfl: 0  Social History   Tobacco Use  Smoking Status Never  Smokeless Tobacco Never    No Known Allergies Objective:  There were no vitals filed for this visit. There is no height or weight on file to calculate BMI. Constitutional Well developed. Well nourished.  Vascular Foot warm and well perfused. Capillary refill normal to all digits.    Neurologic Normal speech. Oriented to person, place, and time. Epicritic sensation to light touch grossly present bilaterally.  Dermatologic Skin healing well without signs of infection. Skin edges well coapted without signs of infection.  Good correction alignment noted  Orthopedic: Mild tenderness to palpation noted about the surgical site.   Radiographs: 3 views of skeletally mature adult left foot: Hardware is intact.  Reduction of talonavicular joint noted.  No signs of backing out or loosening noted Assessment:   No diagnosis found.  Plan:  Patient was evaluated and treated and all questions answered.  S/p foot surgery left -Clinically doing much better he has returned to regular shoes with a regular gait.  No antalgic gait noted if any foot and ankle issues on future he will come back and see me.  At this time we will is reassess him in less than a year for nonunion.  He will come back and see me if there is more pain otherwise he will see me in 1 year  No follow-ups on file.

## 2023-01-10 ENCOUNTER — Encounter: Payer: Self-pay | Admitting: Podiatry

## 2023-01-10 ENCOUNTER — Telehealth: Payer: Self-pay | Admitting: Podiatry

## 2023-01-10 NOTE — Telephone Encounter (Signed)
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 Is this ok? Per. His mother he will probably never be able to work due to his pain

## 2023-01-18 ENCOUNTER — Telehealth: Payer: Self-pay | Admitting: Podiatry

## 2023-01-18 NOTE — Telephone Encounter (Signed)
Pts grandmother called stating pt is in need of new orthotics. He is now wearing tennis shoes instead of the boots and the old ones he has had for a while. I did make her aware it will not be until next month that we have a schedule for that dept. Your note did not mention orthotics. Please advise

## 2023-03-22 ENCOUNTER — Ambulatory Visit (INDEPENDENT_AMBULATORY_CARE_PROVIDER_SITE_OTHER): Payer: Medicaid Other

## 2023-03-22 ENCOUNTER — Ambulatory Visit: Payer: Medicaid Other | Admitting: Podiatry

## 2023-03-22 DIAGNOSIS — M216X1 Other acquired deformities of right foot: Secondary | ICD-10-CM | POA: Diagnosis not present

## 2023-03-22 DIAGNOSIS — T85848A Pain due to other internal prosthetic devices, implants and grafts, initial encounter: Secondary | ICD-10-CM

## 2023-03-22 DIAGNOSIS — M960 Pseudarthrosis after fusion or arthrodesis: Secondary | ICD-10-CM | POA: Diagnosis not present

## 2023-03-22 DIAGNOSIS — Z9889 Other specified postprocedural states: Secondary | ICD-10-CM

## 2023-03-22 DIAGNOSIS — M21962 Unspecified acquired deformity of left lower leg: Secondary | ICD-10-CM

## 2023-03-22 MED ORDER — OXYCODONE-ACETAMINOPHEN 5-325 MG PO TABS: 1.0000 | ORAL_TABLET | ORAL | 0 refills | Status: AC | PRN

## 2023-03-22 NOTE — Progress Notes (Signed)
Subjective:  Patient ID: Kevin Leonard, male    DOB: Aug 21, 2000,  MRN: [id number]  Chief Complaint  Patient presents with   Foot Pain    22 y.o. male presents with the above complaint.  Patient presents with mild hardware pain.  Patient states it feels like his foot is achy.  It was doing pretty good but started noted some achiness denies any other acute complaints wanted to discuss treatment options he is also outgrown his orthotics and would like to know if he can do another pair.  Pain scale 7 out of 10 dull achy in nature.   Review of Systems: Negative except as noted in the HPI. Denies N/V/F/Ch.  Past Medical History:  Diagnosis Date   History of kidney stones    Pes planus, congenital    bilaterally    Current Outpatient Medications:    oxyCODONE-acetaminophen (PERCOCET) 5-325 MG tablet, Take 1 tablet by mouth every 4 (four) hours as needed for severe pain., Disp: 30 tablet, Rfl: 0   doxycycline (VIBRAMYCIN) 100 MG capsule, Take 1 capsule (100 mg total) by mouth 2 (two) times daily. (Patient not taking: Reported on 09/07/2022), Disp: 20 capsule, Rfl: 0   ibuprofen (ADVIL) 800 MG tablet, Take 1 tablet (800 mg total) by mouth every 6 (six) hours as needed., Disp: 60 tablet, Rfl: 1   ondansetron (ZOFRAN-ODT) 4 MG disintegrating tablet, Take 1 tablet (4 mg total) by mouth every 8 (eight) hours as needed for nausea or vomiting., Disp: 20 tablet, Rfl: 0   oxyCODONE (ROXICODONE) 5 MG immediate release tablet, Take 1 tablet (5 mg total) by mouth every 4 (four) hours as needed for severe pain. (Patient not taking: Reported on 09/07/2022), Disp: 5 tablet, Rfl: 0   oxyCODONE-acetaminophen (PERCOCET) 5-325 MG tablet, Take 1 tablet by mouth every 4 (four) hours as needed for severe pain., Disp: 30 tablet, Rfl: 0  Social History   Tobacco Use  Smoking Status Never  Smokeless Tobacco Never    No Known Allergies Objective:  There were no vitals filed for this visit. There is no height  or weight on file to calculate BMI. Constitutional Well developed. Well nourished.  Vascular Dorsalis pedis pulses palpable bilaterally. Posterior tibial pulses palpable bilaterally. Capillary refill normal to all digits.  No cyanosis or clubbing noted. Pedal hair growth normal.  Neurologic Normal speech. Oriented to person, place, and time. Epicritic sensation to light touch grossly present bilaterally.  Dermatologic Nails well groomed and normal in appearance. No open wounds. No skin lesions.  Orthopedic: Pain on palpation of the course of the hardware.  No crepitus noted at the talonavicular joint no range of motion noted at the talonavicular joint.   Radiographs: 3 views of skeletally mature adult left foot: Hardware is intact no signs of backing out or loosening noted.  Appears to be consolidation across the talonavicular joint. Assessment:   1. Pain from implanted hardware, initial encounter   2. Deformity of both feet    Plan:  Patient was evaluated and treated and all questions answered.  Left painful hardware -All questions and concerns were discussed with the patient in extensive detail.  Given that he is experienced some hardware pain he will benefit from removal in the future.  For now I would like the hardware to be in it for a year prior to removal.  For now there is no backing or loosening of the hardware noted.  He will benefit from new custom orthotics.  He will manage it  for now.  Pes planovalgus -I explained to patient the etiology of pes planovalgus and relationship with Planter fasciitis and various treatment options were discussed.  Given patient foot structure in the setting of Planter fasciitis I believe patient will benefit from custom-made orthotics to help control the hindfoot motion support the arch of the foot and take the stress away from plantar fascial.  Patient agrees with the plan like to proceed with orthotics -Patient was casted for orthotics   No  follow-ups on file.

## 2023-03-29 ENCOUNTER — Other Ambulatory Visit: Payer: Medicaid Other

## 2024-05-01 ENCOUNTER — Other Ambulatory Visit: Payer: Self-pay | Admitting: Podiatry

## 2024-05-01 ENCOUNTER — Ambulatory Visit (INDEPENDENT_AMBULATORY_CARE_PROVIDER_SITE_OTHER)

## 2024-05-01 ENCOUNTER — Ambulatory Visit: Admitting: Podiatry

## 2024-05-01 DIAGNOSIS — M21962 Unspecified acquired deformity of left lower leg: Secondary | ICD-10-CM | POA: Diagnosis not present

## 2024-05-01 DIAGNOSIS — M21961 Unspecified acquired deformity of right lower leg: Secondary | ICD-10-CM

## 2024-05-01 DIAGNOSIS — T85848A Pain due to other internal prosthetic devices, implants and grafts, initial encounter: Secondary | ICD-10-CM

## 2024-05-01 MED ORDER — OXYCODONE-ACETAMINOPHEN 5-325 MG PO TABS: 1.0000 | ORAL_TABLET | ORAL | 0 refills | Status: AC | PRN

## 2024-05-01 NOTE — Progress Notes (Signed)
 Subjective:  Patient ID: Kevin Leonard, male    DOB: [birth year],  MRN: [id number]  Chief Complaint  Patient presents with   Foot Pain    23 y.o. male presents with the above complaint.  Patient presents with mild hardware pain.  Patient states it feels like his foot is achy.  He has been wearing orthotics denies any other acute complaints this morning get an x-ray to see how the healing is going  Review of Systems: Negative except as noted in the HPI. Denies N/V/F/Ch.  Past Medical History:  Diagnosis Date   History of kidney stones    Pes planus, congenital    bilaterally    Current Outpatient Medications:    oxyCODONE -acetaminophen  (PERCOCET) 5-325 MG tablet, Take 1 tablet by mouth every 4 (four) hours as needed for severe pain (pain score 7-10)., Disp: 30 tablet, Rfl: 0   doxycycline  (VIBRAMYCIN ) 100 MG capsule, Take 1 capsule (100 mg total) by mouth 2 (two) times daily. (Patient not taking: Reported on 09/07/2022), Disp: 20 capsule, Rfl: 0   ibuprofen  (ADVIL ) 800 MG tablet, Take 1 tablet (800 mg total) by mouth every 6 (six) hours as needed., Disp: 60 tablet, Rfl: 1   ondansetron  (ZOFRAN -ODT) 4 MG disintegrating tablet, Take 1 tablet (4 mg total) by mouth every 8 (eight) hours as needed for nausea or vomiting., Disp: 20 tablet, Rfl: 0   oxyCODONE  (ROXICODONE ) 5 MG immediate release tablet, Take 1 tablet (5 mg total) by mouth every 4 (four) hours as needed for severe pain. (Patient not taking: Reported on 09/07/2022), Disp: 5 tablet, Rfl: 0   oxyCODONE -acetaminophen  (PERCOCET) 5-325 MG tablet, Take 1 tablet by mouth every 4 (four) hours as needed for severe pain., Disp: 30 tablet, Rfl: 0   oxyCODONE -acetaminophen  (PERCOCET) 5-325 MG tablet, Take 1 tablet by mouth every 4 (four) hours as needed for severe pain., Disp: 30 tablet, Rfl: 0  Social History   Tobacco Use  Smoking Status Never  Smokeless Tobacco Never    No Known Allergies Objective:  There were no vitals filed  for this visit. There is no height or weight on file to calculate BMI. Constitutional Well developed. Well nourished.  Vascular Dorsalis pedis pulses palpable bilaterally. Posterior tibial pulses palpable bilaterally. Capillary refill normal to all digits.  No cyanosis or clubbing noted. Pedal hair growth normal.  Neurologic Normal speech. Oriented to person, place, and time. Epicritic sensation to light touch grossly present bilaterally.  Dermatologic Nails well groomed and normal in appearance. No open wounds. No skin lesions.  Orthopedic: Pain on palpation of the course of the hardware.  No crepitus noted at the talonavicular joint no range of motion noted at the talonavicular joint.   Radiographs: 3 views of skeletally mature adult left foot: Hardware is intact no signs of backing out or loosening noted.  Appears to be consolidation across the talonavicular joint. Assessment:   1. Deformity of both feet    Plan:  Patient was evaluated and treated and all questions answered.  Left painful hardware -All questions and concerns were discussed with the patient in extensive detail.  At this time clinically his pain is more manageable.  MD his new x-ray shows consolidation across the talonavicular joint. - I encouraged him to continue wearing good shoes.  He will also benefit from referral to pain management for some of the pain he is having.  He does not want to do any kind of revisional surgery at this time. - If he is pain  for hardware continues to bother him we will plan on getting a new CT scan and removal of orthopedic hardware  Pes planovalgus -I explained to patient the etiology of pes planovalgus and relationship with Planter fasciitis and various treatment options were discussed.  Given patient foot structure in the setting of Planter fasciitis I believe patient will benefit from custom-made orthotics to help control the hindfoot motion support the arch of the foot and take the  stress away from plantar fascial.  Patient agrees with the plan like to proceed with orthotics - Patient has orthotics and is functioning well from them.   No follow-ups on file.

## 2024-05-23 ENCOUNTER — Telehealth: Payer: Self-pay | Admitting: Podiatry

## 2024-05-23 NOTE — Telephone Encounter (Signed)
 Grandmother is requesting that the letter dated 01/10/23 regarding the patient's medical condition be sent to the social worker: Solicitor email [email or web address].   Letter was emailed.
